# Patient Record
Sex: Male | Born: 1990 | Race: White | Hispanic: No | Marital: Single | Smoking: Never smoker
Health system: Southern US, Community
[De-identification: ages and names within clinical notes are randomized; demographics above are authoritative.]

## PROBLEM LIST (undated history)

## (undated) DIAGNOSIS — G809 Cerebral palsy, unspecified: Secondary | ICD-10-CM

## (undated) DIAGNOSIS — R569 Unspecified convulsions: Secondary | ICD-10-CM

## (undated) DIAGNOSIS — F809 Developmental disorder of speech and language, unspecified: Secondary | ICD-10-CM

## (undated) DIAGNOSIS — G808 Other cerebral palsy: Secondary | ICD-10-CM

## (undated) HISTORY — DX: Unspecified convulsions: R56.9

## (undated) HISTORY — PX: CIRCUMCISION: SUR203

---
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
## 2011-09-24 HISTORY — PX: TENDON RELEASE: SHX230

## 2011-09-24 HISTORY — PX: FOOT CAPSULE RELEASE W/ PERCUTANEOUS HEEL CORD LENGTHENING, TIBIAL TENDON TRANSFER: SHX1658

## 2011-12-12 ENCOUNTER — Ambulatory Visit: Payer: Medicaid Other | Admitting: *Deleted

## 2011-12-12 ENCOUNTER — Ambulatory Visit: Payer: Medicaid Other | Attending: Orthopedic Surgery | Admitting: Occupational Therapy

## 2011-12-12 ENCOUNTER — Ambulatory Visit: Payer: Medicaid Other | Attending: Orthopedic Surgery | Admitting: Physical Therapy

## 2011-12-12 DIAGNOSIS — M2560 Stiffness of unspecified joint, not elsewhere classified: Secondary | ICD-10-CM | POA: Insufficient documentation

## 2011-12-12 DIAGNOSIS — G808 Other cerebral palsy: Secondary | ICD-10-CM | POA: Insufficient documentation

## 2011-12-12 DIAGNOSIS — IMO0001 Reserved for inherently not codable concepts without codable children: Secondary | ICD-10-CM | POA: Insufficient documentation

## 2011-12-27 ENCOUNTER — Ambulatory Visit: Payer: Medicaid Other | Admitting: Occupational Therapy

## 2012-01-02 ENCOUNTER — Encounter: Payer: Medicaid Other | Admitting: Occupational Therapy

## 2012-01-02 ENCOUNTER — Ambulatory Visit: Payer: Medicaid Other | Attending: Orthopedic Surgery | Admitting: Occupational Therapy

## 2012-01-02 DIAGNOSIS — G808 Other cerebral palsy: Secondary | ICD-10-CM | POA: Insufficient documentation

## 2012-01-02 DIAGNOSIS — M2560 Stiffness of unspecified joint, not elsewhere classified: Secondary | ICD-10-CM | POA: Insufficient documentation

## 2012-01-02 DIAGNOSIS — IMO0001 Reserved for inherently not codable concepts without codable children: Secondary | ICD-10-CM | POA: Insufficient documentation

## 2012-01-10 ENCOUNTER — Ambulatory Visit: Payer: Medicaid Other | Admitting: Occupational Therapy

## 2012-01-23 ENCOUNTER — Ambulatory Visit: Payer: Medicaid Other | Admitting: Occupational Therapy

## 2012-05-06 ENCOUNTER — Other Ambulatory Visit: Payer: Self-pay

## 2012-05-06 DIAGNOSIS — G40209 Localization-related (focal) (partial) symptomatic epilepsy and epileptic syndromes with complex partial seizures, not intractable, without status epilepticus: Secondary | ICD-10-CM

## 2012-05-06 DIAGNOSIS — G40309 Generalized idiopathic epilepsy and epileptic syndromes, not intractable, without status epilepticus: Secondary | ICD-10-CM

## 2012-05-06 MED ORDER — DEPAKOTE 250 MG PO TBEC: DELAYED_RELEASE_TABLET | ORAL | Status: DC

## 2012-05-27 ENCOUNTER — Ambulatory Visit (INDEPENDENT_AMBULATORY_CARE_PROVIDER_SITE_OTHER): Payer: Medicaid Other | Admitting: Family

## 2012-05-27 ENCOUNTER — Encounter: Payer: Self-pay | Admitting: Family

## 2012-05-27 VITALS — BP 116/72 | HR 84 | Ht 66.75 in | Wt 178.0 lb

## 2012-05-27 DIAGNOSIS — G40209 Localization-related (focal) (partial) symptomatic epilepsy and epileptic syndromes with complex partial seizures, not intractable, without status epilepticus: Secondary | ICD-10-CM

## 2012-05-27 DIAGNOSIS — G40309 Generalized idiopathic epilepsy and epileptic syndromes, not intractable, without status epilepticus: Secondary | ICD-10-CM

## 2012-05-27 DIAGNOSIS — F809 Developmental disorder of speech and language, unspecified: Secondary | ICD-10-CM

## 2012-05-27 DIAGNOSIS — G808 Other cerebral palsy: Secondary | ICD-10-CM

## 2012-05-27 DIAGNOSIS — G830 Diplegia of upper limbs: Secondary | ICD-10-CM

## 2012-05-27 DIAGNOSIS — F801 Expressive language disorder: Secondary | ICD-10-CM

## 2012-05-27 MED ORDER — DEPAKOTE 250 MG PO TBEC: DELAYED_RELEASE_TABLET | ORAL | Status: DC

## 2012-05-27 NOTE — Progress Notes (Signed)
Patient: Carl Bryant MRN: [id number] Sex: male DOB: 1990-10-23  Provider: Elveria Rising, NP Location of Care: [facility] Child Neurology  Note type: Routine return visit  History of Present Illness:  Referral Source: Dr. Teena Irani. Rubin History from: Father Chief Complaint: Epilepsy/Hemiplegic Infantile Cerebral Palsy  Carl Bryant is a 22 y.o. 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  He also has attention-deficit disorder, mixed type. He has had no seizures since 01/17/1999.  He is no longer taking Concerta since being out of school.   Since Carl Bryant was last seen in March, 2013, his father reports that he had surgery by Dr. 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 His father said that he had some initial difficulty with pain control and with mobility but overall did well with recovery. He has a follow up appointment soon and he expects that plans may be made to do surgery on his fingers. Carl Bryant has been wearing an AFO on his right lower extremity since the surgery.  Since recovering from his surgery, Carl Bryant has been working part time at [employer] and works part time helping an Event organiser. He will be attending [employer] this summer. He has been otherwise healthy and has had no seizures.  Review of Systems: 12 system review was unremarkable  Past Medical History  Diagnosis Date  . Seizures    Hospitalizations: yes, Head Injury: no, Nervous System Infections: no, Immunizations up to date: yes Past Medical History Comments: right hemiparesis, diplegic gait, complex partial seizures with secondary generalization, developmental language disorder, dyscalculia  Birth History He is adopted  Behavior History attention difficulties  Surgical  History History reviewed. No pertinent past surgical history. Surgeries: yes Surgical History Comments: Surgery on right wrist and elbow and right foot surgery Sept. 2013.  Family History family history is not on file. He is adopted. Family History is negative migraines, seizures, cognitive impairment, blindness, deafness, birth defects, chromosomal disorder, autism.  Social History History   Social History  . Marital Status: Single    Spouse Name: N/A    Number of Children: N/A  . Years of Education: N/A   Social History Main Topics  . Smoking status: Never Smoker   . Smokeless tobacco: None  . Alcohol Use: No  . Drug Use: No  . Sexually Active: None   Other Topics Concern  . None   Social History Narrative  . 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 Living with Adoptive parents and younger adoptive sister  Hobbies/Interest: enjoys sports, involved with [employer]  Current Outpatient Prescriptions on File Prior to Visit  Medication Sig Dispense Refill  . DEPAKOTE 250 MG DR tablet Take 1 tab by mouth 4 times daily.  120 tablet  0   No current facility-administered medications on file prior to visit.   The medication list was reviewed and reconciled. All changes or newly prescribed medications were explained.  A complete medication list was provided to the patient/caregiver.  No Known Allergies  Physical Exam BP 116/72  Pulse 84  Ht 5' 6.75" (1.695 m)  Wt 178 lb (80.74 kg)  BMI 28.1 kg/m2 General: well developed, well nourished male, seated on exam table, in no  evident distress Head: head  normocephalic and atraumatic.  Ears, Nose and Throat: Oropharynx benign Neck: supple with no carotid or supraclavicular bruits. Respiratory: lungs clear to auscultation Cardiovascular: regular rate and rhythm, no  murmurs Musculoskeletal: Hemiatrophy and contractures of his right wrist and elbow, as well as both knees. Severe right spastic hemiparesis Skin: mild facial acne Trunk: normal alignment and mobility, no deformity  Neurologic Exam  Mental Status: Awake and fully alert.  Mild mental retardation. Able to answer questions appropriately.  Mood and affect appropriate. Cranial Nerves: Fundoscopic exam reveals sharp disc margins.  Pupils equal, briskly reactive to light.  Extraocular movements full without nystagmus.  Visual fields full to confrontation.  Hearing intact and symmetric to finger rub.  Facial sensation intact.  Face, tongue, palate move normally and symmetrically.  Neck flexion and extension normal. Motor: Hemiatrophy and contractures of his right wrist and elbow, as well as both knees. Severe right spastic hemiparesis. He has 4/5 strength proximally on the right and 3/5 strength distally. The left extremities have normal strength. He has limited ability to perform finger opposition on the right, with the thumb and forefinger barely touching.  Sensory: Intact to touch and temperature in all extremities. Coordination: Coordination good on the left, poor on the right.  Romberg negative. Gait and Station: He has a spastic diplegic gait that is more pronounced than when last seen. His left foot over-pronates when he walks. He complains of some pain in the left knee when walking.  Reflexes: 1+ and symmetric. There is no clonus.  Assessment and Plan Carl Bryant is a 22 year old young man with history of congenital right hemiparesis, diplegic gait, complex partial seizures with secondary generalization, developmental language disorder and dyscalculia, He has had no seizures and is tolerating Depakote well. I will make no changes in his treatment plan at this time. I have asked him to return for follow up in 1 year or sooner if needed. Dad agrees with this plan.

## 2012-05-30 ENCOUNTER — Encounter: Payer: Self-pay | Admitting: Family

## 2012-05-30 DIAGNOSIS — G40209 Localization-related (focal) (partial) symptomatic epilepsy and epileptic syndromes with complex partial seizures, not intractable, without status epilepticus: Secondary | ICD-10-CM | POA: Insufficient documentation

## 2012-05-30 DIAGNOSIS — G808 Other cerebral palsy: Secondary | ICD-10-CM | POA: Insufficient documentation

## 2012-05-30 DIAGNOSIS — G40309 Generalized idiopathic epilepsy and epileptic syndromes, not intractable, without status epilepticus: Secondary | ICD-10-CM | POA: Insufficient documentation

## 2012-05-30 DIAGNOSIS — G830 Diplegia of upper limbs: Secondary | ICD-10-CM | POA: Insufficient documentation

## 2012-05-30 DIAGNOSIS — F809 Developmental disorder of speech and language, unspecified: Secondary | ICD-10-CM | POA: Insufficient documentation

## 2012-05-30 NOTE — Patient Instructions (Signed)
Continue taking Depakote without change.  Let me know if you experience any seizures or have any other concerns. Plan to return for follow up in 1 year or sooner if needed.

## 2012-08-20 ENCOUNTER — Ambulatory Visit: Payer: Medicaid Other | Admitting: Family

## 2013-06-06 ENCOUNTER — Other Ambulatory Visit: Payer: Self-pay

## 2013-06-06 DIAGNOSIS — G40209 Localization-related (focal) (partial) symptomatic epilepsy and epileptic syndromes with complex partial seizures, not intractable, without status epilepticus: Secondary | ICD-10-CM

## 2013-06-06 DIAGNOSIS — G40309 Generalized idiopathic epilepsy and epileptic syndromes, not intractable, without status epilepticus: Secondary | ICD-10-CM

## 2013-06-06 MED ORDER — DEPAKOTE 250 MG PO TBEC: DELAYED_RELEASE_TABLET | ORAL | Status: DC

## 2013-06-10 ENCOUNTER — Encounter: Payer: Self-pay | Admitting: Family

## 2013-06-10 ENCOUNTER — Ambulatory Visit (INDEPENDENT_AMBULATORY_CARE_PROVIDER_SITE_OTHER): Payer: Medicaid Other | Admitting: Family

## 2013-06-10 VITALS — BP 124/80 | HR 82 | Ht 66.75 in | Wt 192.2 lb

## 2013-06-10 DIAGNOSIS — G808 Other cerebral palsy: Secondary | ICD-10-CM

## 2013-06-10 DIAGNOSIS — G40309 Generalized idiopathic epilepsy and epileptic syndromes, not intractable, without status epilepticus: Secondary | ICD-10-CM

## 2013-06-10 DIAGNOSIS — G830 Diplegia of upper limbs: Secondary | ICD-10-CM

## 2013-06-10 DIAGNOSIS — G40209 Localization-related (focal) (partial) symptomatic epilepsy and epileptic syndromes with complex partial seizures, not intractable, without status epilepticus: Secondary | ICD-10-CM

## 2013-06-10 DIAGNOSIS — F801 Expressive language disorder: Secondary | ICD-10-CM

## 2013-06-10 DIAGNOSIS — F809 Developmental disorder of speech and language, unspecified: Secondary | ICD-10-CM

## 2013-06-10 MED ORDER — DEPAKOTE 250 MG PO TBEC: DELAYED_RELEASE_TABLET | ORAL | Status: DC

## 2013-06-10 NOTE — Progress Notes (Signed)
Patient: Carl Bryant Shilling MRN: [id number] Sex: male DOB: [birth year]  Provider: Elveria RisingGOODPASTURE, Brenyn Petrey, NP [facility]  Note type: Routine return visit  History of Present Illness: Referral Source: Dr.David M. Rubin History from: patient's father Chief Complaint: Cerebral Palsy/Seizures  Carl Bryant Lavalle is a 23 y.o. 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 He also has attention-deficit disorder, mixed type but no longer takes medication for this since he is out of school. He has had no seizures since 01/17/1999.  Today Carl Bryant's father reports that Carl Bryant has been doing well since he was last seen on May 30, 2012. He went skiing in the winter and did well with that. He will be playing soccer in [employer] this year as well as [employer]. He will be attending [employer] for 6 weeks and is looking forward to swimming there.  Carl Bryant continues to follow 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 His father says that he  May be having a plate removed from his wrist as well as surgery on the 3rd finger of this right hand that looks to be developing a trigger finger type deformity.   Carl Bryant has been otherwise healthy since last seen. He and his father have no concerns or complaints today.  Review of Systems: 12 system review was unremarkable  Past Medical History  Diagnosis Date  . Seizures    Hospitalizations: no, Head Injury: no, Nervous System Infections: no, Immunizations up to date: yes Past Medical History Comments: right hemiparesis, diplegic gait, complex partial seizures with secondary generalization, developmental language disorder, dyscalculia   Surgical History Past Surgical History  Procedure  Laterality Date  . Tendon release  09/2011    right wrist, elbow  . Foot capsule release Bryant/ percutaneous heel cord lengthening, tibial tendon transfer  09/2011    right foot    Family History family history is not on file. He was adopted. Family History is otherwise negative for migraines, seizures, cognitive impairment, blindness, deafness, birth defects, chromosomal disorder, autism.  Social History History   Social History  . Marital Status: Single    Spouse Name: N/A    Number of Children: N/A  . Years of Education: N/A   Social History Main Topics  . Smoking status: Never Smoker   . Smokeless tobacco: Never Used  . Alcohol Use: No  . Drug Use: No  . Sexual Activity: No   Other Topics Concern  . None   Social History Narrative  . None   Educational level: 12th grade School Attending:N/A Living with:  both parents and sister  Hobbies/Interest: watching sports like skiing School comments:  Carl Bryant is currently volunteering at [employer] [place] [employer].  Physical Exam BP 124/80  Pulse 82  Ht 5' 6.75" (1.695 m)  Wt 192 lb 3.2 oz (87.181 kg)  BMI 30.34 kg/m2 General: well developed, well nourished male, seated in exam room, in no evident distress  Head: head normocephalic and atraumatic.  Ears, Nose and Throat: Oropharynx benign  Neck: supple with no carotid or supraclavicular bruits.  Respiratory: lungs clear to auscultation  Cardiovascular: regular rate and rhythm, no murmurs  Musculoskeletal: Hemiatrophy and contractures of his right wrist and elbow, as well as both knees. The 3rd finger of the right hand is  flexed toward the palm. Severe right spastic hemiparesis  Skin: mild facial acne  Trunk: normal alignment and mobility, no deformity   Neurologic Exam  Mental Status: Awake and fully alert. Mild cognitive delay. Able to answer basic questions appropriately. Mood and affect appropriate.  Cranial Nerves: Fundoscopic exam reveals  sharp disc margins. Pupils equal, briskly reactive to light. Extraocular movements full without nystagmus. Visual fields full to confrontation. Hearing intact and symmetric to finger rub. Facial sensation intact. Face, tongue, palate move normally and symmetrically. Neck flexion and extension normal.  Motor: Hemiatrophy and contractures of his right wrist and elbow, as well as both knees. Severe right spastic hemiparesis. He has 4/5 strength proximally on the right and 3/5 strength distally. The left extremities have normal strength. He has limited ability to perform finger opposition on the right, with the thumb and forefinger barely touching.  Sensory: Intact to touch and temperature in all extremities.  Coordination: Coordination good on the left, poor on the right. Romberg negative.  Gait and Station: He has a spastic diplegic gait. His left foot over-pronates when he walks.  Reflexes: 1+ and symmetric. There is no clonus.   Assessment and Plan Carl Bryant is a 23 year old young man with history of congenital right hemiparesis, diplegic gait, complex partial seizures with secondary generalization, developmental language disorder and dyscalculia, He has had no seizures and is tolerating Depakote well. I will make no changes in his treatment plan at this time. I completed forms for him to participate in [employer]. I will see him in follow up in 1 year or sooner if needed. Dad agrees with this plan

## 2013-06-12 ENCOUNTER — Encounter: Payer: Self-pay | Admitting: Family

## 2013-06-12 NOTE — Patient Instructions (Signed)
Continue Gates's medications without change.  Call me if he has any seizures or if you have any concerns.  Please plan to return for follow up in 1 year or sooner if needed.

## 2014-01-01 ENCOUNTER — Other Ambulatory Visit: Payer: Self-pay

## 2014-01-01 DIAGNOSIS — G40309 Generalized idiopathic epilepsy and epileptic syndromes, not intractable, without status epilepticus: Secondary | ICD-10-CM

## 2014-01-01 DIAGNOSIS — G40209 Localization-related (focal) (partial) symptomatic epilepsy and epileptic syndromes with complex partial seizures, not intractable, without status epilepticus: Secondary | ICD-10-CM

## 2014-01-01 MED ORDER — DEPAKOTE 250 MG PO TBEC: DELAYED_RELEASE_TABLET | ORAL | Status: DC

## 2014-06-11 ENCOUNTER — Ambulatory Visit (INDEPENDENT_AMBULATORY_CARE_PROVIDER_SITE_OTHER): Payer: Medicaid Other | Admitting: Family

## 2014-06-11 ENCOUNTER — Encounter: Payer: Self-pay | Admitting: Family

## 2014-06-11 VITALS — BP 128/84 | HR 86 | Ht 66.75 in | Wt 194.4 lb

## 2014-06-11 DIAGNOSIS — G830 Diplegia of upper limbs: Secondary | ICD-10-CM | POA: Diagnosis not present

## 2014-06-11 DIAGNOSIS — M2450 Contracture, unspecified joint: Secondary | ICD-10-CM | POA: Diagnosis not present

## 2014-06-11 DIAGNOSIS — G808 Other cerebral palsy: Secondary | ICD-10-CM | POA: Diagnosis not present

## 2014-06-11 DIAGNOSIS — G40209 Localization-related (focal) (partial) symptomatic epilepsy and epileptic syndromes with complex partial seizures, not intractable, without status epilepticus: Secondary | ICD-10-CM

## 2014-06-11 DIAGNOSIS — G40309 Generalized idiopathic epilepsy and epileptic syndromes, not intractable, without status epilepticus: Secondary | ICD-10-CM | POA: Diagnosis not present

## 2014-06-11 DIAGNOSIS — F809 Developmental disorder of speech and language, unspecified: Secondary | ICD-10-CM | POA: Diagnosis not present

## 2014-06-11 NOTE — Progress Notes (Signed)
Patient: Carl Bryant MRN: [id number] Sex: male DOB: [birth year]  Provider: Elveria RisingGOODPASTURE, Preet Perrier, NP [facility]  Note type: Routine return visit  History of Present Illness: Referral Source: Dr. Teena Iraniavid M. Rubin History from: patient and his mother Chief Complaint: Cerebral Palsy/Seizures   Carl Bryant is a 24 y.o. 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 He also has attention-deficit disorder, mixed type but no longer takes medication for this since he is out of school. He has had no seizures since 01/17/1999. Carl Bryant was last seen Jun 10, 2013.  Today Carl Bryant's mother reports that Carl Bryant has been doing well since he was last seen. 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 Terel bowls regularly with his father.  Carl Bryant continues to follow up with 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 He may need to have a plate removed from his wrist as well as surgery on the 3rd finger of this right hand that looks to be developing a trigger finger type deformity.   Carl Bryant has been otherwise healthy since last seen. His mother says that he sometimes complains that his heart beats fast. She has noticed that he mentions it when he is excited or anxious about something. The most recent complaints occurred while at a rodeo and he became excited while watching bull riding.  Neither Carl Bryant nor his mother have other health concerns about him today.  Review of Systems: Please see the HPI for neurologic and other pertinent review of systems. Otherwise, the following systems are noncontributory including constitutional, eyes, ears, nose and throat, cardiovascular, respiratory,  gastrointestinal, genitourinary, musculoskeletal, skin, endocrine, hematologic/lymph, allergic/immunologic and psychiatric.   Past Medical History  Diagnosis Date  . Seizures    Hospitalizations: No., Head Injury: No., Nervous System Infections: No., Immunizations up to date: Yes.   Past Medical History Comments: right hemiparesis, diplegic gait, complex partial seizures with secondary generalization, developmental language disorder, dyscalculia   Surgical History Past Surgical History  Procedure Laterality Date  . Tendon release  09/2011    right wrist, elbow  . Foot capsule release w/ percutaneous heel cord lengthening, tibial tendon transfer  09/2011    right foot  . Circumcision  1990    Family History family history is not on file. He was adopted. Family History is otherwise negative for migraines, seizures, cognitive impairment, blindness, deafness, birth defects, chromosomal disorder, autism.  Social History History   Social History  . Marital Status: Single    Spouse Name: N/A  . Number of Children: N/A  . Years of Education: N/A   Social History Main Topics  . Smoking status: Never Smoker   . Smokeless tobacco: Never Used  . Alcohol Use: No  . Drug Use: No  . Sexual Activity: No   Other Topics Concern  . 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 School comments:  Carl Bryant completed his education in 2013 with a certificate from [employer].  Allergies No Known Allergies  Physical Exam BP 128/84 mmHg  Pulse 86  Ht 5' 6.75" (1.695 m)  Wt 194 lb 6.4 oz (88.179  kg)  BMI 30.69 kg/m2 General: well developed, well nourished male, seated in exam room, in no evident distress  Head: head normocephalic and atraumatic.  Ears, Nose and Throat: Oropharynx  benign  Neck: supple with no carotid or supraclavicular bruits.  Respiratory: lungs clear to auscultation  Cardiovascular: regular rate and rhythm, no murmurs  Musculoskeletal: Hemiatrophy and contractures of his right wrist and elbow, as well as both knees. The 3rd finger of the right hand is flexed toward the palm. Severe right spastic hemiparesis  Skin: mild facial acne  Trunk: normal alignment and mobility, no deformity   Neurologic Exam  Mental Status: Awake and fully alert. Mild cognitive delay. Able to answer basic questions appropriately. Mood and affect appropriate.  Cranial Nerves: Fundoscopic exam reveals sharp disc margins. Pupils equal, briskly reactive to light. Extraocular movements full without nystagmus. Visual fields full to confrontation. Hearing intact and symmetric to finger rub. Facial sensation intact. Face, tongue, palate move normally and symmetrically. Neck flexion and extension normal.  Motor: Hemiatrophy and contractures of his right wrist and elbow, as well as both knees. Severe right spastic hemiparesis. He has 4/5 strength proximally on the right and 3/5 strength distally. The left extremities have normal strength. He has limited ability to perform finger opposition on the right, with the thumb and forefinger barely touching.  Sensory: Intact to touch and temperature in all extremities.  Coordination: Coordination good on the left, poor on the right. Romberg negative.  Gait and Station: He has a spastic diplegic gait. His left foot over-pronates when he walks.  Reflexes: 1+ and symmetric. There is no clonus.   Impression 1. Right hemiparesis 2. Diplegic gait 3. Complex partial seizures with secondary generalization 4. Developmental language disorder 5. Dyscalculia 6. Attention deficit disorder  Recommendations for plan of care The patient's [facility] were reviewed. Carl Bryant has neither had nor required imaging or lab studies since the  last visit. He is a 24 year old young man with history of congenital right hemiparesis, diplegic gait, complex partial seizures with secondary generalization, developmental language disorder and dyscalculia, He has had no seizures and is tolerating Depakote well. I will make no changes in his treatment plan at this time. He reports occasional feelings that his heart is beating fast. The examination is normal today. I encouraged Carl Bryant and his mother to report this to his PCP.  I will see him in follow up in 1 year or sooner if needed. Carl Bryant and his mother agree with this plan  The medication list was reviewed and reconciled.  No changes were made in the prescribed medications today.  A complete medication list was provided to the patient/caregiver.  Total time spent with the patient was 25 minutes, of which 50% or more was spent in counseling and coordination of care.

## 2014-06-12 DIAGNOSIS — M2450 Contracture, unspecified joint: Secondary | ICD-10-CM | POA: Insufficient documentation

## 2014-06-12 NOTE — Patient Instructions (Signed)
Continue Kao's Depakote without change. Let me know if he has any breakthrough seizures.  Follow up with Dr Jacky KindleAronson about his occasional feeling that his heart is beating fast.   Please plan to return for follow up in 1 year or sooner if needed.

## 2014-07-03 ENCOUNTER — Other Ambulatory Visit: Payer: Self-pay

## 2014-07-03 DIAGNOSIS — G40209 Localization-related (focal) (partial) symptomatic epilepsy and epileptic syndromes with complex partial seizures, not intractable, without status epilepticus: Secondary | ICD-10-CM

## 2014-07-03 DIAGNOSIS — G40309 Generalized idiopathic epilepsy and epileptic syndromes, not intractable, without status epilepticus: Secondary | ICD-10-CM

## 2014-07-03 MED ORDER — DEPAKOTE 250 MG PO TBEC: DELAYED_RELEASE_TABLET | ORAL | Status: DC

## 2014-12-31 ENCOUNTER — Other Ambulatory Visit: Payer: Self-pay

## 2014-12-31 DIAGNOSIS — G40309 Generalized idiopathic epilepsy and epileptic syndromes, not intractable, without status epilepticus: Secondary | ICD-10-CM

## 2014-12-31 DIAGNOSIS — G40209 Localization-related (focal) (partial) symptomatic epilepsy and epileptic syndromes with complex partial seizures, not intractable, without status epilepticus: Secondary | ICD-10-CM

## 2014-12-31 MED ORDER — DEPAKOTE 250 MG PO TBEC: DELAYED_RELEASE_TABLET | ORAL | Status: DC

## 2015-01-24 ENCOUNTER — Emergency Department (INDEPENDENT_AMBULATORY_CARE_PROVIDER_SITE_OTHER): Payer: Medicaid Other

## 2015-01-24 ENCOUNTER — Encounter (HOSPITAL_COMMUNITY): Payer: Self-pay | Admitting: Emergency Medicine

## 2015-01-24 ENCOUNTER — Inpatient Hospital Stay (HOSPITAL_COMMUNITY)
Admission: EM | Admit: 2015-01-24 | Discharge: 2015-01-29 | DRG: 871 | Disposition: A | Discharge status: Home / Self Care | Payer: Medicaid Other | Attending: Internal Medicine | Admitting: Internal Medicine

## 2015-01-24 ENCOUNTER — Encounter (HOSPITAL_COMMUNITY): Payer: Self-pay | Admitting: Nurse Practitioner

## 2015-01-24 ENCOUNTER — Emergency Department (INDEPENDENT_AMBULATORY_CARE_PROVIDER_SITE_OTHER)
Admission: EM | Admit: 2015-01-24 | Discharge: 2015-01-24 | Disposition: A | Discharge status: Home / Self Care | Payer: Medicaid Other | Source: Home / Self Care | Attending: Internal Medicine | Admitting: Internal Medicine

## 2015-01-24 DIAGNOSIS — E870 Hyperosmolality and hypernatremia: Secondary | ICD-10-CM | POA: Diagnosis not present

## 2015-01-24 DIAGNOSIS — J181 Lobar pneumonia, unspecified organism: Principal | ICD-10-CM

## 2015-01-24 DIAGNOSIS — E876 Hypokalemia: Secondary | ICD-10-CM | POA: Diagnosis not present

## 2015-01-24 DIAGNOSIS — E860 Dehydration: Secondary | ICD-10-CM | POA: Diagnosis present

## 2015-01-24 DIAGNOSIS — T39395A Adverse effect of other nonsteroidal anti-inflammatory drugs [NSAID], initial encounter: Secondary | ICD-10-CM | POA: Diagnosis present

## 2015-01-24 DIAGNOSIS — F809 Developmental disorder of speech and language, unspecified: Secondary | ICD-10-CM | POA: Diagnosis present

## 2015-01-24 DIAGNOSIS — G808 Other cerebral palsy: Secondary | ICD-10-CM | POA: Diagnosis present

## 2015-01-24 DIAGNOSIS — R945 Abnormal results of liver function studies: Secondary | ICD-10-CM

## 2015-01-24 DIAGNOSIS — J189 Pneumonia, unspecified organism: Secondary | ICD-10-CM

## 2015-01-24 DIAGNOSIS — G40409 Other generalized epilepsy and epileptic syndromes, not intractable, without status epilepticus: Secondary | ICD-10-CM | POA: Diagnosis present

## 2015-01-24 DIAGNOSIS — N179 Acute kidney failure, unspecified: Secondary | ICD-10-CM | POA: Diagnosis present

## 2015-01-24 DIAGNOSIS — R7989 Other specified abnormal findings of blood chemistry: Secondary | ICD-10-CM

## 2015-01-24 DIAGNOSIS — A413 Sepsis due to Hemophilus influenzae: Secondary | ICD-10-CM | POA: Diagnosis present

## 2015-01-24 DIAGNOSIS — F70 Mild intellectual disabilities: Secondary | ICD-10-CM | POA: Diagnosis present

## 2015-01-24 DIAGNOSIS — G40309 Generalized idiopathic epilepsy and epileptic syndromes, not intractable, without status epilepticus: Secondary | ICD-10-CM | POA: Diagnosis present

## 2015-01-24 DIAGNOSIS — A4150 Gram-negative sepsis, unspecified: Principal | ICD-10-CM | POA: Diagnosis present

## 2015-01-24 DIAGNOSIS — R112 Nausea with vomiting, unspecified: Secondary | ICD-10-CM | POA: Diagnosis present

## 2015-01-24 DIAGNOSIS — Z79899 Other long term (current) drug therapy: Secondary | ICD-10-CM

## 2015-01-24 DIAGNOSIS — A419 Sepsis, unspecified organism: Secondary | ICD-10-CM | POA: Diagnosis present

## 2015-01-24 HISTORY — DX: Other cerebral palsy: G80.8

## 2015-01-24 HISTORY — DX: Developmental disorder of speech and language, unspecified: F80.9

## 2015-01-24 HISTORY — DX: Cerebral palsy, unspecified: G80.9

## 2015-01-24 LAB — COMPREHENSIVE METABOLIC PANEL
ALBUMIN: 3.3 g/dL — AB (ref 3.5–5.0)
ALK PHOS: 65 U/L (ref 38–126)
ALT: 45 U/L (ref 17–63)
AST: 30 U/L (ref 15–41)
Anion gap: 14 (ref 5–15)
BILIRUBIN TOTAL: 0.8 mg/dL (ref 0.3–1.2)
BUN: 9 mg/dL (ref 6–20)
CALCIUM: 9.4 mg/dL (ref 8.9–10.3)
CO2: 22 mmol/L (ref 22–32)
Chloride: 103 mmol/L (ref 101–111)
Creatinine, Ser: 1.26 mg/dL — ABNORMAL HIGH (ref 0.61–1.24)
GFR calc Af Amer: >60 mL/min (ref >60)
GFR calc non Af Amer: >60 mL/min (ref >60)
GLUCOSE: 109 mg/dL — AB (ref 65–99)
Potassium: 4.3 mmol/L (ref 3.5–5.1)
SODIUM: 139 mmol/L (ref 135–145)
TOTAL PROTEIN: 7.1 g/dL (ref 6.5–8.1)

## 2015-01-24 LAB — URINE MICROSCOPIC-ADD ON
BACTERIA UA: NONE SEEN
RBC / HPF: NONE SEEN RBC/hpf (ref 0–5)
WBC, UA: NONE SEEN WBC/hpf (ref 0–5)

## 2015-01-24 LAB — CBC WITH DIFFERENTIAL/PLATELET
BASOS ABS: 0 10*3/uL (ref 0.0–0.1)
BASOS PCT: 0 %
EOS PCT: 0 %
Eosinophils Absolute: 0 10*3/uL (ref 0.0–0.7)
HEMATOCRIT: 39.8 % (ref 39.0–52.0)
Hemoglobin: 13.5 g/dL (ref 13.0–17.0)
Lymphocytes Relative: 10 %
Lymphs Abs: 1.3 10*3/uL (ref 0.7–4.0)
MCH: 28.2 pg (ref 26.0–34.0)
MCHC: 33.9 g/dL (ref 30.0–36.0)
MCV: 83.3 fL (ref 78.0–100.0)
MONO ABS: 2.1 10*3/uL — AB (ref 0.1–1.0)
MONOS PCT: 15 %
Neutro Abs: 10.4 10*3/uL — ABNORMAL HIGH (ref 1.7–7.7)
Neutrophils Relative %: 75 %
PLATELETS: 193 10*3/uL (ref 150–400)
RBC: 4.78 MIL/uL (ref 4.22–5.81)
RDW: 15.3 % (ref 11.5–15.5)
WBC: 13.8 10*3/uL — ABNORMAL HIGH (ref 4.0–10.5)

## 2015-01-24 LAB — I-STAT CG4 LACTIC ACID, ED: Lactic Acid, Venous: 2.69 mmol/L (ref 0.5–2.0)

## 2015-01-24 LAB — URINALYSIS, ROUTINE W REFLEX MICROSCOPIC
BILIRUBIN URINE: NEGATIVE
GLUCOSE, UA: NEGATIVE mg/dL
KETONES UR: NEGATIVE mg/dL
LEUKOCYTES UA: NEGATIVE
NITRITE: NEGATIVE
PH: 7.5 (ref 5.0–8.0)
PROTEIN: NEGATIVE mg/dL
Specific Gravity, Urine: 1.007 (ref 1.005–1.030)

## 2015-01-24 LAB — LACTIC ACID, PLASMA
LACTIC ACID, VENOUS: 2.7 mmol/L — AB (ref 0.5–2.0)
Lactic Acid, Venous: 3.4 mmol/L (ref 0.5–2.0)

## 2015-01-24 MED ORDER — IBUPROFEN 400 MG PO TABS
ORAL_TABLET | ORAL | Status: AC
  Filled 2015-01-24: qty 1

## 2015-01-24 MED ORDER — ACETAMINOPHEN 325 MG PO TABS
ORAL_TABLET | ORAL | Status: AC
  Filled 2015-01-24: qty 2

## 2015-01-24 MED ORDER — SODIUM CHLORIDE 0.9 % IV BOLUS (SEPSIS)
1000.0000 mL | Freq: Once | INTRAVENOUS | Status: AC
  Administered 2015-01-24: 1000 mL via INTRAVENOUS

## 2015-01-24 MED ORDER — IBUPROFEN 200 MG PO TABS
600.0000 mg | ORAL_TABLET | Freq: Once | ORAL | Status: AC
  Administered 2015-01-24: 600 mg via ORAL

## 2015-01-24 MED ORDER — IBUPROFEN 200 MG PO TABS
ORAL_TABLET | ORAL | Status: AC
  Filled 2015-01-24: qty 1

## 2015-01-24 MED ORDER — SODIUM CHLORIDE 0.9 % IV SOLN: Freq: Once | INTRAVENOUS | Status: DC

## 2015-01-24 MED ORDER — ACETAMINOPHEN 325 MG PO TABS
650.0000 mg | ORAL_TABLET | Freq: Once | ORAL | Status: AC
  Administered 2015-01-24: 650 mg via ORAL

## 2015-01-24 MED ORDER — DEXTROSE 5 % IV SOLN
500.0000 mg | INTRAVENOUS | Status: DC
  Administered 2015-01-25: 500 mg via INTRAVENOUS
  Filled 2015-01-24: qty 500

## 2015-01-24 MED ORDER — DEXTROSE 5 % IV SOLN
1.0000 g | INTRAVENOUS | Status: DC
  Administered 2015-01-25: 1 g via INTRAVENOUS
  Filled 2015-01-24 (×2): qty 10

## 2015-01-24 MED ORDER — AZITHROMYCIN 500 MG IV SOLR
500.0000 mg | Freq: Once | INTRAVENOUS | Status: AC
  Administered 2015-01-24: 500 mg via INTRAVENOUS
  Filled 2015-01-24: qty 500

## 2015-01-24 MED ORDER — ONDANSETRON HCL 4 MG/2ML IJ SOLN
4.0000 mg | Freq: Once | INTRAMUSCULAR | Status: AC
  Administered 2015-01-24: 4 mg via INTRAVENOUS
  Filled 2015-01-24: qty 2

## 2015-01-24 MED ORDER — SODIUM CHLORIDE 0.9 % IV SOLN: 1000.0000 mL | INTRAVENOUS | Status: DC

## 2015-01-24 MED ORDER — CEFTRIAXONE SODIUM 1 G IJ SOLR
1.0000 g | Freq: Once | INTRAMUSCULAR | Status: AC
  Administered 2015-01-24: 1 g via INTRAVENOUS
  Filled 2015-01-24: qty 10

## 2015-01-24 MED ORDER — SODIUM CHLORIDE 0.9 % IV BOLUS (SEPSIS)
1000.0000 mL | INTRAVENOUS | Status: AC
  Administered 2015-01-24 (×3): 1000 mL via INTRAVENOUS

## 2015-01-24 NOTE — ED Notes (Signed)
Patient called in main ED waiting area with no response 

## 2015-01-24 NOTE — ED Notes (Signed)
Patient is a special needs adult with mother as caregiver with him.  Patient and family went on a [place] cruise for 5 days and arrived back yesterday.  While on cruise, patient participated in snorkeling.  With childhood history of pneumonia, mother concerned patient is exhibiting the same symptoms as when diagnosed with pneumonia (multiple episodes as a child).  Mother reports family arrived home yesterday, and started complaining of headache and fever.  Mother reports fever yesterday was 103.4.

## 2015-01-24 NOTE — ED Provider Notes (Addendum)
CSN: [id number]     Arrival date & time 01/24/15  1323 History   First MD Initiated Contact with Patient 01/24/15 1507     Chief Complaint  Patient presents with  . Fever  . Chills  . Generalized Body Aches   HPI  Patient is a 25 year old gentleman who presents today with the abrupt onset of fever and headache, cough with posttussive emesis, that started yesterday. He has not had a flu shot this season. He does have some history of childhood pneumonias, and returned yesterday from a five-day cruise during which he went snorkeling. Parents are little concerned about the possibility of occult aspiration. Has some sore throat. Had an episode of dysuria today. No diarrhea. Also has reported a stomachache.  Past Medical History  Diagnosis Date  . Seizures [facility](HCC)    Past Surgical History  Procedure Laterality Date  . Tendon release  09/2011    right wrist, elbow  . Foot capsule release w/ percutaneous heel cord lengthening, tibial tendon transfer  09/2011    right foot  . Circumcision  1990   Family History  Problem Relation Age of Onset  . Adopted: Yes   Social History  Substance Use Topics  . Smoking status: Never Smoker   . Smokeless tobacco: Never Used  . Alcohol Use: No    Review of Systems  All other systems reviewed and are negative.   Allergies  Review of patient's allergies indicates no known allergies.  Home Medications   Prior to Admission medications   Medication Sig Start Date End Date Taking? Authorizing Provider  acetaminophen (TYLENOL) 325 MG tablet Take 650 mg by mouth every 6 (six) hours as needed.   Yes Historical Provider, MD  DEPAKOTE 250 MG DR tablet Take 1 tab by mouth 4 times daily. 12/31/14  Yes Elveria Risingina Goodpasture, NP  ibuprofen (ADVIL,MOTRIN) 200 MG tablet Take 200 mg by mouth every 6 (six) hours as needed.   Yes Historical Provider, MD     BP 121/76 mmHg  Pulse 140  Temp(Src) 98.2 F (36.8 C) (Oral)  Resp 30  SpO2 95%   Physical Exam   Constitutional: He is oriented to person, place, and time. No distress.  Alert, nicely groomed Reclining on exam table, able to sit up for exam Looks ill but not toxic   HENT:  Head: Atraumatic.  Bilateral TMs with marked dullness, patchy erythema Moderate nasal congestion with mucopurulent material present Throat is slightly red  Eyes:  Conjugate gaze, no eye redness/drainage  Neck: Neck supple.  Cardiovascular: Regular rhythm.   Heart rate 140s  Pulmonary/Chest: No respiratory distress. He has no wheezes.  Crackles posterior L lung fields, extending to mid axillary line  Abdominal: Soft.  Mild tenderness to deep palpation in the left lower quadrant Some guarding  Musculoskeletal: Normal range of motion. He exhibits no edema.  Neurological: He is alert and oriented to person, place, and time.  Skin: Skin is warm and dry.  No cyanosis  Nursing note and vitals reviewed.   ED Course  Procedures (including critical care time)   CLINICAL DATA: Fever and productive cough.  EXAM: CHEST 2 VIEW  COMPARISON: None.  FINDINGS: There is an extensive consolidative pneumonia in the posterior superior aspect of the left lower lobe. Prominent bilateral peribronchial thickening. No effusions. Heart size and vascularity are normal.  IMPRESSION: Left lower lobe pneumonia. Bronchitic changes.   Electronically Signed  By: Francene BoyersJames Maxwell M.D.  On: 01/24/2015 15:29      MDM  1. Left lower lobe pneumonia    Transferring to ED for consideration for IVF, iv antibiotics, for L lower lobe pneumonia with tachycardia, in young adult with history pneumonias, ? Cerebral palsy.     Eustace Moore, MD 01/24/15 1545  Eustace Moore, MD 01/25/15 [phone]

## 2015-01-24 NOTE — ED Notes (Signed)
Explained delay to pt and family. They request to speak to charge nurse.

## 2015-01-24 NOTE — ED Notes (Signed)
Urgent care transfer

## 2015-01-24 NOTE — ED Notes (Signed)
Pt has been in triage waiting the whole time.

## 2015-01-24 NOTE — ED Notes (Addendum)
First set of blood cultures were drawn in Triage.  Second set was accidentally clicked off.

## 2015-01-24 NOTE — ED Notes (Signed)
Lab called to report Lactic Acid 2.7.  Advised MD.

## 2015-01-24 NOTE — Progress Notes (Signed)
ANTIBIOTIC CONSULT NOTE - INITIAL  Pharmacy Consult for Ceftriaxone and Azithromycin Indication: pneumonia  No Known Allergies  Patient Measurements: Height: 5\' 7"  (170.2 cm) Weight: 198 lb 4 oz (89.926 kg) IBW/kg (Calculated) : 66.1  Vital Signs: Temp: 97.7 F (36.5 C) (01/01 2004) Temp Source: Oral (01/01 2004) BP: 116/68 mmHg (01/01 2004) Pulse Rate: 107 (01/01 2004) Intake/Output from previous day:   Intake/Output from this shift:    Labs:  Recent Labs  01/24/15 1639  WBC 13.8*  HGB 13.5  PLT 193  CREATININE 1.26*   Estimated Creatinine Clearance: 95 mL/min (by C-G formula based on Cr of 1.26). No results for input(s): VANCOTROUGH, VANCOPEAK, VANCORANDOM, GENTTROUGH, GENTPEAK, GENTRANDOM, TOBRATROUGH, TOBRAPEAK, TOBRARND, AMIKACINPEAK, AMIKACINTROU, AMIKACIN in the last 72 hours.   Microbiology: No results found for this or any previous visit (from the past 720 hour(s)).  Medical History: Past Medical History  Diagnosis Date  . Seizures (HCC)     Medications:  Anti-infectives    Start     Dose/Rate Route Frequency Ordered Stop   01/25/15 2200  azithromycin (ZITHROMAX) 500 mg in dextrose 5 % 250 mL IVPB     500 mg 250 mL/hr over 60 Minutes Intravenous Every 24 hours 01/24/15 2128     01/25/15 2100  cefTRIAXone (ROCEPHIN) 1 g in dextrose 5 % 50 mL IVPB     1 g 100 mL/hr over 30 Minutes Intravenous Every 24 hours 01/24/15 2128     01/24/15 2115  cefTRIAXone (ROCEPHIN) 1 g in dextrose 5 % 50 mL IVPB     1 g 100 mL/hr over 30 Minutes Intravenous  Once 01/24/15 2111     01/24/15 2115  azithromycin (ZITHROMAX) 500 mg in dextrose 5 % 250 mL IVPB     500 mg 250 mL/hr over 60 Minutes Intravenous  Once 01/24/15 2111       Assessment: 25 year old male with CAP to continue Ceftriaxone and Azithromycin.  These antibiotics do not require renal adjustment.  Plan:  Ceftriaxone 1g IV q24h Azithromycin 500mg  IV q24h As no dosage adjustment are required pharmacy  will sign off. Thank you for the consult.  Estella HuskMichelle Breigh Annett, Pharm.D., BCPS, AAHIVP Clinical Pharmacist Phone: [phone] or [phone] 01/24/2015, 9:31 PM

## 2015-01-24 NOTE — ED Notes (Addendum)
Ibuprofen 600mg  PO for fever was VO [facility] EDNP

## 2015-01-24 NOTE — ED Notes (Signed)
[facility] after [place] this [place]. CXR there showed LL lobe pneumonia. Pt has had body aches, fevers, and not feeling well since returning from the trip. He is A&Ox4, breathing easily. He was medicated with tylenol for fever [facility]

## 2015-01-25 ENCOUNTER — Encounter (HOSPITAL_COMMUNITY): Payer: Self-pay | Admitting: Internal Medicine

## 2015-01-25 DIAGNOSIS — E860 Dehydration: Secondary | ICD-10-CM | POA: Diagnosis present

## 2015-01-25 DIAGNOSIS — A419 Sepsis, unspecified organism: Secondary | ICD-10-CM | POA: Diagnosis not present

## 2015-01-25 DIAGNOSIS — N179 Acute kidney failure, unspecified: Secondary | ICD-10-CM | POA: Diagnosis present

## 2015-01-25 DIAGNOSIS — Z79899 Other long term (current) drug therapy: Secondary | ICD-10-CM | POA: Diagnosis not present

## 2015-01-25 DIAGNOSIS — G40309 Generalized idiopathic epilepsy and epileptic syndromes, not intractable, without status epilepticus: Secondary | ICD-10-CM | POA: Diagnosis not present

## 2015-01-25 DIAGNOSIS — J189 Pneumonia, unspecified organism: Secondary | ICD-10-CM | POA: Diagnosis not present

## 2015-01-25 DIAGNOSIS — E870 Hyperosmolality and hypernatremia: Secondary | ICD-10-CM | POA: Diagnosis not present

## 2015-01-25 DIAGNOSIS — G808 Other cerebral palsy: Secondary | ICD-10-CM | POA: Diagnosis present

## 2015-01-25 DIAGNOSIS — E876 Hypokalemia: Secondary | ICD-10-CM | POA: Diagnosis not present

## 2015-01-25 DIAGNOSIS — A413 Sepsis due to Hemophilus influenzae: Secondary | ICD-10-CM | POA: Diagnosis present

## 2015-01-25 DIAGNOSIS — R112 Nausea with vomiting, unspecified: Secondary | ICD-10-CM | POA: Diagnosis not present

## 2015-01-25 DIAGNOSIS — A4150 Gram-negative sepsis, unspecified: Secondary | ICD-10-CM | POA: Diagnosis present

## 2015-01-25 DIAGNOSIS — T39395A Adverse effect of other nonsteroidal anti-inflammatory drugs [NSAID], initial encounter: Secondary | ICD-10-CM | POA: Diagnosis present

## 2015-01-25 DIAGNOSIS — G40409 Other generalized epilepsy and epileptic syndromes, not intractable, without status epilepticus: Secondary | ICD-10-CM | POA: Diagnosis present

## 2015-01-25 DIAGNOSIS — F70 Mild intellectual disabilities: Secondary | ICD-10-CM | POA: Diagnosis present

## 2015-01-25 DIAGNOSIS — F809 Developmental disorder of speech and language, unspecified: Secondary | ICD-10-CM | POA: Diagnosis present

## 2015-01-25 LAB — INFLUENZA PANEL BY PCR (TYPE A & B)
H1N1FLUPCR: NOT DETECTED
INFLBPCR: NEGATIVE
Influenza A By PCR: NEGATIVE

## 2015-01-25 LAB — LIPASE, BLOOD: LIPASE: 23 U/L (ref 11–51)

## 2015-01-25 LAB — STREP PNEUMONIAE URINARY ANTIGEN: STREP PNEUMO URINARY ANTIGEN: NEGATIVE

## 2015-01-25 LAB — PROTIME-INR
INR: 1.39 (ref 0.00–1.49)
PROTHROMBIN TIME: 17.2 s — AB (ref 11.6–15.2)

## 2015-01-25 LAB — PROCALCITONIN: Procalcitonin: 27.4 ng/mL

## 2015-01-25 LAB — LACTIC ACID, PLASMA
LACTIC ACID, VENOUS: 2.2 mmol/L — AB (ref 0.5–2.0)
Lactic Acid, Venous: 1.9 mmol/L (ref 0.5–2.0)

## 2015-01-25 LAB — APTT: APTT: 25 s (ref 24–37)

## 2015-01-25 LAB — CREATININE, URINE, RANDOM: CREATININE, URINE: 19.37 mg/dL

## 2015-01-25 LAB — SODIUM, URINE, RANDOM: Sodium, Ur: 15 mmol/L

## 2015-01-25 MED ORDER — SODIUM CHLORIDE 0.9 % IV BOLUS (SEPSIS)
500.0000 mL | Freq: Once | INTRAVENOUS | Status: AC
  Administered 2015-01-25: 500 mL via INTRAVENOUS

## 2015-01-25 MED ORDER — DM-GUAIFENESIN ER 30-600 MG PO TB12
1.0000 | ORAL_TABLET | Freq: Two times a day (BID) | ORAL | Status: DC
  Administered 2015-01-25 – 2015-01-29 (×10): 1 via ORAL
  Filled 2015-01-25 (×10): qty 1

## 2015-01-25 MED ORDER — DIVALPROEX SODIUM 250 MG PO DR TAB
250.0000 mg | DELAYED_RELEASE_TABLET | Freq: Four times a day (QID) | ORAL | Status: DC
  Administered 2015-01-25 – 2015-01-29 (×15): 250 mg via ORAL
  Filled 2015-01-25 (×19): qty 1

## 2015-01-25 MED ORDER — SODIUM CHLORIDE 0.9 % IV SOLN
INTRAVENOUS | Status: DC
  Administered 2015-01-25 – 2015-01-26 (×5): via INTRAVENOUS

## 2015-01-25 MED ORDER — ONDANSETRON HCL 4 MG/2ML IJ SOLN
4.0000 mg | Freq: Three times a day (TID) | INTRAMUSCULAR | Status: DC | PRN
  Administered 2015-01-26 – 2015-01-27 (×2): 4 mg via INTRAVENOUS
  Filled 2015-01-25 (×2): qty 2

## 2015-01-25 MED ORDER — ACETAMINOPHEN 325 MG PO TABS
650.0000 mg | ORAL_TABLET | Freq: Four times a day (QID) | ORAL | Status: DC | PRN
  Administered 2015-01-25: 650 mg via ORAL
  Filled 2015-01-25: qty 2

## 2015-01-25 MED ORDER — SODIUM CHLORIDE 0.9 % IV SOLN
500.0000 mg | Freq: Two times a day (BID) | INTRAVENOUS | Status: DC
  Filled 2015-01-25 (×4): qty 5

## 2015-01-25 MED ORDER — HEPARIN SODIUM (PORCINE) 5000 UNIT/ML IJ SOLN
5000.0000 [IU] | Freq: Three times a day (TID) | INTRAMUSCULAR | Status: DC
  Administered 2015-01-25 – 2015-01-29 (×11): 5000 [IU] via SUBCUTANEOUS
  Filled 2015-01-25 (×11): qty 1

## 2015-01-25 MED ORDER — DEXTROSE 5 % IV SOLN
500.0000 mg | INTRAVENOUS | Status: DC
Start: 2015-01-25 — End: 2015-01-25

## 2015-01-25 MED ORDER — DIVALPROEX SODIUM 250 MG PO DR TAB
250.0000 mg | DELAYED_RELEASE_TABLET | Freq: Four times a day (QID) | ORAL | Status: DC
  Administered 2015-01-25: 250 mg via ORAL
  Filled 2015-01-25 (×5): qty 1

## 2015-01-25 NOTE — Evaluation (Signed)
Physical Therapy Evaluation Patient Details Name: Carl RegalDaniel W Gehret MRN: [id number] DOB: 07/02/1990 Today's Date: 01/25/2015   History of Present Illness  25 y.o. male with PMH of cerebral palsy with mild right-sided weakness, mental retardation, seizure, who presents with fever, cough, shortness of breath and admitted with CAP.  Clinical Impression  Pt admitted with above diagnosis. Pt currently with functional limitations due to the deficits listed below (see PT Problem List).  Pt will benefit from skilled PT to increase their independence and safety with mobility to allow discharge to the venue listed below. Pt ambulated 120' on 2 L/min with 2/4 dyspnea with o2 sats 97% and HR 113. Discussed with family and feel that he does not need any follow up PT at home.     Follow Up Recommendations No PT follow up    Equipment Recommendations  None recommended by PT    Recommendations for Other Services       Precautions / Restrictions Precautions Precautions: None      Mobility  Bed Mobility Overal bed mobility: Needs Assistance Bed Mobility: Supine to Sit     Supine to sit: Supervision;HOB elevated     General bed mobility comments: use of rail to get turned, but no physical A.  Transfers Overall transfer level: Needs assistance Equipment used: None Transfers: Sit to/from Stand Sit to Stand: Supervision            Ambulation/Gait Ambulation/Gait assistance: Min guard Ambulation Distance (Feet): 120 Feet Assistive device: None Gait Pattern/deviations: Wide base of support;Decreased stride length     General Gait Details: Wide BOS with lateral sway.  Family reports this is how he walked prior to admission. Amb on 2 L/min with 2/4 dyspnea with mask on due to droplet precautions. O2 97% and HR 113 after gait  Stairs            Wheelchair Mobility    Modified Rankin (Stroke Patients Only)       Balance Overall balance assessment: Needs  assistance Sitting-balance support: Feet supported Sitting balance-Leahy Scale: Good       Standing balance-Leahy Scale: Good                               Pertinent Vitals/Pain Pain Assessment: No/denies pain    Home Living Family/patient expects to be discharged to:: Private residence Living Arrangements: Parent Available Help at Discharge: Family;Available 24 hours/day Type of Home: House Home Access: Level entry     Home Layout: One level Home Equipment: Grab bars - tub/shower (hiking stick)      Prior Function Level of Independence: Independent         Comments: I with ambulation occasional use of hiking stick when outside     Hand Dominance        Extremity/Trunk Assessment   Upper Extremity Assessment: Defer to OT evaluation           Lower Extremity Assessment: Overall WFL for tasks assessed;Generalized weakness;RLE deficits/detail RLE Deficits / Details: limited ROM due to orthopedic injury, but doesn't majorly impair function       Communication   Communication: No difficulties  Cognition Arousal/Alertness: Awake/alert Behavior During Therapy: WFL for tasks assessed/performed Overall Cognitive Status: History of cognitive impairments - at baseline                      General Comments General comments (skin integrity, edema, etc.): Pt distracts easily  by IV line, o2 tubing, but easy to re-focus.  Family present.    Exercises        Assessment/Plan    PT Assessment Patient needs continued PT services  PT Diagnosis Difficulty walking;Generalized weakness   PT Problem List Decreased strength;Decreased activity tolerance;Decreased mobility  PT Treatment Interventions DME instruction;Gait training;Functional mobility training;Therapeutic activities;Balance training   PT Goals (Current goals can be found in the Care Plan section) Acute Rehab PT Goals Patient Stated Goal: get walking farther/endurance PT Goal  Formulation: With family Time For Goal Achievement: 02/01/15 Potential to Achieve Goals: Good    Frequency Min 3X/week   Barriers to discharge        Co-evaluation               End of Session Equipment Utilized During Treatment: Gait belt;Oxygen Activity Tolerance: Patient limited by fatigue;Patient tolerated treatment well Patient left: in bed;with call bell/phone within reach;with family/visitor present Nurse Communication: Mobility status         Time: 8119-1478 PT Time Calculation (min) (ACUTE ONLY): 30 min   Charges:   PT Evaluation $Initial PT Evaluation Tier I: 1 Procedure (moderate tier) PT Treatments $Gait Training: 8-22 mins   PT G Codes:        Donelda Mailhot LUBECK 01/25/2015, 3:15 PM

## 2015-01-25 NOTE — Evaluation (Signed)
Clinical/Bedside Swallow Evaluation Patient Details  Name: Carl Bryant MRN: [id number] Date of Birth: 05-28-1990  Today's Date: 01/25/2015 Time: SLP Start Time (ACUTE ONLY): 1448 SLP Stop Time (ACUTE ONLY): 1517 SLP Time Calculation (min) (ACUTE ONLY): 29 min  Past Medical History:  Past Medical History  Diagnosis Date  . Seizures (HCC)   . Congenital hemiparesis (HCC)   . Developmental language disorder   . Cerebral palsy [facility])    Past Surgical History:  Past Surgical History  Procedure Laterality Date  . Tendon release  09/2011    right wrist, elbow  . Foot capsule release w/ percutaneous heel cord lengthening, tibial tendon transfer  09/2011    right foot  . Circumcision  19963   HPI:  25 year old male with history of cerebral palsy with right-sided weakness, mental retardation, seizure disorder, now admitted for left lower lobe pneumonia. Mom reports fever, cough, and SOB which started 1 day prior to admission after returning from 5 day cruise in which patient went snorkling. Associated with some nausea and vomiting of phlegm.    Assessment / Plan / Recommendation Clinical Impression  Patient presents with a functional appearing oropharyngeal swallow. Baseline cough noted which slightly increased during po intake although appeared related to coordination of swallow and respirations rather than overt aspiration. Family does report an increase in coughing episodes when "chugging" liquids over the pst 2-3 months which is common for patient given impulsivity. Do not suspect that acute PNA is related to aspiration although diagnostics at bedside challenging given baseline coughing. Discussed with patient and family. Plan made to follow up on 1/3 for continued diagnostics at bedside. Hopeful that baseline cough will have minimized and allow for clearer diagnostic at bedside with po intake.     Aspiration Risk  Mild aspiration risk    Diet Recommendation Regular;Thin liquid    Liquid Administration via: Cup;Straw Medication Administration: Whole meds with liquid Supervision: Patient able to self feed;Full supervision/cueing for compensatory strategies Compensations: Slow rate;Small sips/bites Postural Changes: Seated upright at 90 degrees    Other  Recommendations Oral Care Recommendations: Oral care BID   Follow up Recommendations  None    Frequency and Duration min 1 x/week  1 week        Swallow Study   General HPI: 25 year old male with history of cerebral palsy with right-sided weakness, mental retardation, seizure disorder, now admitted for left lower lobe pneumonia. Mom reports fever, cough, and SOB which started 1 day prior to admission after returning from 5 day cruise in which patient went snorkling. Associated with some nausea and vomiting of phlegm.  Type of Study: Bedside Swallow Evaluation Previous Swallow Assessment: none noted Diet Prior to this Study: Regular;Thin liquids Temperature Spikes Noted: Yes Respiratory Status: Nasal cannula History of Recent Intubation: No Behavior/Cognition: Alert;Cooperative;Pleasant mood Oral Cavity Assessment: Within Functional Limits Oral Care Completed by SLP: No Oral Cavity - Dentition: Dentures, not available Vision: Functional for self-feeding Self-Feeding Abilities: Able to feed self Patient Positioning: Upright in bed Baseline Vocal Quality: Normal Volitional Cough: Strong Volitional Swallow: Able to elicit    Oral/Motor/Sensory Function Overall Oral Motor/Sensory Function: Within functional limits   Ice Chips Ice chips: Not tested   Thin Liquid Thin Liquid: Within functional limits Presentation: Cup;Self Fed;Straw    Nectar Thick Nectar Thick Liquid: Not tested   Honey Thick Honey Thick Liquid: Not tested   Puree Puree: Within functional limits Presentation: Spoon   Solid   GO  Solid: Impaired Presentation: Self Fed Oral Phase Impairments: Impaired mastication (using liquid  wash to aid in transit, baseline per pt/family) Oral Phase Functional Implications: Impaired mastication      Carl Hum MA, CCC-SLP [phone]  Carl Bryant 01/25/2015,3:23 PM

## 2015-01-25 NOTE — H&P (Addendum)
[facility] History and Physical  Carl Bryant ZOX:[id number] DOB: 01-Aug-1990 DOA: 01/24/2015  Referring physician: ED physician PCP: Minda Meo, MD  Specialists:   Chief Complaint: fever, cough and SOB  HPI: Carl Bryant is a 25 y.o. male with PMH of cerebral palsy with mild right-sided weakness, mental retardation, seizure, who presents with fever, cough, shortness of breath.  Patient's mother reported that the patient started having fever, cough and shortness of breath after returned yesterday from a five-day cruise, during which he went snorkeling. He coughs up pink colored sputum. Temperature is up to 103.4 at home. It is associated with some nausea, and vomiting of phlegm. Patient does not have abdominal pain or diarrhea. Patient has mild right-sided weakness due to cerebral palsy, which has not changed. Patient does not have symptoms of UTI, vision change or hearing loss. Patient was initially evaluated in [facility] and had chest x-ray just showed left lower lobe pneumonia, therefore transfered to the emergency room.  In ED, patient was found to have left lower lobe pneumonia by chest x-ray, lactate 3.4--> 2.7--> 2.69, negative urinalysis, WBC 13.8, temperature 103, tachycardia, tachypnea, acute renal injury with creatinine 1.26, BUN 9. Patient is admitted to inpatient for further intervention and treatment.  EKG: Independently reviewed.  Not done in ED, will get one.   Where does patient live?   At home   Can patient participate in ADLs?  Yes  Review of Systems:   General: has fevers, chills, no changes in body weight, has poor appetite, has fatigue HEENT: no blurry vision, hearing changes or sore throat Pulm: has dyspnea, coughing, no wheezing CV: no chest pain, palpitations Abd: has nausea, vomiting, no abdominal pain, diarrhea, constipation GU: no dysuria, burning on urination, increased urinary frequency, hematuria  Ext: no leg edema Neuro: no unilateral  weakness, numbness, or tingling, no vision change or hearing loss Skin: no rash MSK: No muscle spasm, no deformity, no limitation of range of movement in spin Heme: No easy bruising.  Travel history: No recent long distant travel.  Allergy: No Known Allergies  Past Medical History  Diagnosis Date  . Seizures (HCC)   . Congenital hemiparesis (HCC)   . Developmental language disorder   . Cerebral palsy [facility])     Past Surgical History  Procedure Laterality Date  . Tendon release  09/2011    right wrist, elbow  . Foot capsule release w/ percutaneous heel cord lengthening, tibial tendon transfer  09/2011    right foot  . Circumcision  1990    Social History:  reports that he has never smoked. He has never used smokeless tobacco. He reports that he does not drink alcohol or use illicit drugs.  Family History:  Family History  Problem Relation Age of Onset  . Adopted: Yes     Prior to Admission medications   Medication Sig Start Date End Date Taking? Authorizing Provider  acetaminophen (TYLENOL) 325 MG tablet Take 650 mg by mouth every 6 (six) hours as needed for mild pain.    Yes Historical Provider, MD  DEPAKOTE 250 MG DR tablet Take 1 tab by mouth 4 times daily. 12/31/14  Yes Elveria Rising, NP  ibuprofen (ADVIL,MOTRIN) 200 MG tablet Take 200 mg by mouth every 6 (six) hours as needed.   Yes Historical Provider, MD    Physical Exam: Filed Vitals:   01/24/15 2004 01/24/15 2200 01/24/15 2345 01/25/15 0000  BP: 116/68 109/58  120/62  Pulse: 107 101 112 109  Temp: 97.7  F (36.5 C)     TempSrc: Oral     Resp: 20 28  27   Height:      Weight:      SpO2: 95% 95% 95%    General: Not in acute distress HEENT:       Eyes: PERRL, EOMI, no scleral icterus.       ENT: No discharge from the ears and nose, no pharynx injection, no tonsillar enlargement.        Neck: No JVD, no bruit, no mass felt. Heme: No neck lymph node enlargement. Cardiac: S1/S2, RRR, tachycardia, No murmurs,  No gallops or rubs. Pulm: has fine crackles over posterior L lung fields, No wheezing, rhonchi or rubs. Abd: Soft, nondistended, nontender, no rebound pain, no organomegaly, BS present. Ext: No pitting leg edema bilaterally. 2+DP/PT pulse bilaterally. Musculoskeletal: No joint deformities, No joint redness or warmth, no limitation of ROM in spin. Skin: No rashes.  Neuro: Alert, oriented X3, cranial nerves II-XII grossly intact, has mild right sided weakness. Psych: Patient is not psychotic, no suicidal or hemocidal ideation.  Labs on Admission:  Basic Metabolic Panel:  Recent Labs Lab 01/24/15 1639  NA 139  K 4.3  CL 103  CO2 22  GLUCOSE 109*  BUN 9  CREATININE 1.26*  CALCIUM 9.4   Liver Function Tests:  Recent Labs Lab 01/24/15 1639  AST 30  ALT 45  ALKPHOS 65  BILITOT 0.8  PROT 7.1  ALBUMIN 3.3*   No results for input(s): LIPASE, AMYLASE in the last 168 hours. No results for input(s): AMMONIA in the last 168 hours. CBC:  Recent Labs Lab 01/24/15 1639  WBC 13.8*  NEUTROABS 10.4*  HGB 13.5  HCT 39.8  MCV 83.3  PLT 193   Cardiac Enzymes: No results for input(s): CKTOTAL, CKMB, CKMBINDEX, TROPONINI in the last 168 hours.  BNP (last 3 results) No results for input(s): BNP in the last 8760 hours.  ProBNP (last 3 results) No results for input(s): PROBNP in the last 8760 hours.  CBG: No results for input(s): GLUCAP in the last 168 hours.  Radiological Exams on Admission: Dg Chest 2 View  01/24/2015  CLINICAL DATA:  Fever and productive cough. EXAM: CHEST  2 VIEW COMPARISON:  None. FINDINGS: There is an extensive consolidative pneumonia in the posterior superior aspect of the left lower lobe. Prominent bilateral peribronchial thickening. No effusions. Heart size and vascularity are normal. IMPRESSION: Left lower lobe pneumonia.  Bronchitic changes. Electronically Signed   By: Francene Boyers M.D.   On: 01/24/2015 15:29    Assessment/Plan Principal Problem:    CAP (community acquired pneumonia) Active Problems:   Generalized convulsive epilepsy (HCC)   Congenital hemiparesis (HCC)   Developmental language disorder   Sepsis (HCC)   AKI (acute kidney injury) (HCC)   Nausea & vomiting  Sepsis 2/2 to CAP: Patient has pneumonia as shown by chest x-ray. His septic with leukocytosis, fever, tachycardia, tachypnea and elevated lactate. Hemodynamically stable.  - will admit to tele bed - Rocephin and azithromycin - Mucinex for cough  - Urine legionella and S. pneumococcal antigen - Follow up blood culture x2, sputum culture and respiratory virus panel, plus Flu pcr - will get Procalcitonin and trend lactic acid level per sepsis protocol - IVF: 3.5L of NS bolus in ED, followed by 125 mL per hour of NS  Generalized convulsive epilepsy (HCC): -switch oral keppra to IV keppra 500 mg bid since pt can not tolerate oral now  AKI: Likely due  to prerenal secondary to dehydration NSAIDs - IVF as above - Check FeNa - Follow up renal function by BMP - Hold ibuprofen  Nausea and vomiting: Most likely due to sepsis -When necessary Zofran for nausea -Check lipase   DVT ppx: SQ Heparin    Code Status: Full code Family Communication:  Yes, patient's  mother  at bed side Disposition Plan: Admit to inpatient   Date of Service 01/25/2015    Lorretta HarpIU, Minah Axelrod [facility] Pager [phone]  If 7PM-7AM, please contact night-coverage [email or web address] Password [email or web address] 01/25/2015, 12:52 AM

## 2015-01-25 NOTE — Care Management Note (Signed)
Case Management Note  Patient Details  Name: Carl Bryant MRN: [id number] Date of Birth: 10/29/1990  Subjective/Objective:    Date: 01/25/15 Spoke with patient at the bedside along with Mother.  Introduced self as Sports coachcase manager and explained role in discharge planning and how to be reached.  Verified patient lives in town, with mom. Expressed potential need for no other DME.  Verified patient anticipates to go home with family,at time of discharge and will have full-time supervision by family at this time to best of their knowledge.  Mom denied needing help with their medication.  Patient  is driven by mom to MD appointments.  Verified patient has PCP Geoffry Paradiseichard Aronson.  Patient is not active with any HH agency at this time to best of their knowledge.   Plan: CM will continue to follow [facility].                Action/Plan:   Expected Discharge Date:                  Expected Discharge Plan:  Home/Self Care  In-House Referral:     Discharge planning Services  CM Consult  Post Acute Care Choice:    Choice offered to:     DME Arranged:    DME Agency:     HH Arranged:    HH Agency:     Status of Service:  In process, will continue to follow  Medicare Important Message Given:    Date Medicare IM Given:    Medicare IM give by:    Date Additional Medicare IM Given:    Additional Medicare Important Message give by:     If discussed at Long Length of Stay Meetings, dates discussed:    Additional Comments:  Leone Havenaylor, Suleyman Ehrman [place], RN 01/25/2015, 11:38 AM

## 2015-01-25 NOTE — Progress Notes (Signed)
Patient seen and examined  History of cerebral palsy with right-sided weakness, mental retardation, seizure disorder, now admitted for left lower lobe pneumonia  Last documented to be febrile at 1600 on 01/24/68 Continues to be tachycardic, currently requiring 2 L by nasal cannula at 93%  Blood culture no growth so far, influenza panel negative, strep pneumo urine negative, Legionella urine pending, respiratory virus panel pending, lactate improving now 1.9  Plan Continue IV antibiotics, day #1, continue IV fluids at 1 25 mL/h Continue Keppra IV SLP consult

## 2015-01-25 NOTE — Progress Notes (Signed)
[facility] Awaiting patient's arrival to the floor

## 2015-01-26 ENCOUNTER — Inpatient Hospital Stay (HOSPITAL_COMMUNITY): Payer: Medicaid Other

## 2015-01-26 LAB — CBC
HEMATOCRIT: 37.9 % — AB (ref 39.0–52.0)
HEMOGLOBIN: 12.4 g/dL — AB (ref 13.0–17.0)
MCH: 27.6 pg (ref 26.0–34.0)
MCHC: 32.7 g/dL (ref 30.0–36.0)
MCV: 84.2 fL (ref 78.0–100.0)
Platelets: 184 10*3/uL (ref 150–400)
RBC: 4.5 MIL/uL (ref 4.22–5.81)
RDW: 15.7 % — ABNORMAL HIGH (ref 11.5–15.5)
WBC: 17.5 10*3/uL — ABNORMAL HIGH (ref 4.0–10.5)

## 2015-01-26 LAB — COMPREHENSIVE METABOLIC PANEL
ALK PHOS: 96 U/L (ref 38–126)
ALT: 36 U/L (ref 17–63)
ANION GAP: 11 (ref 5–15)
AST: 62 U/L — ABNORMAL HIGH (ref 15–41)
Albumin: 3 g/dL — ABNORMAL LOW (ref 3.5–5.0)
BILIRUBIN TOTAL: 0.4 mg/dL (ref 0.3–1.2)
BUN: <5 mg/dL — ABNORMAL LOW (ref 6–20)
CALCIUM: 8.9 mg/dL (ref 8.9–10.3)
CO2: 23 mmol/L (ref 22–32)
Chloride: 113 mmol/L — ABNORMAL HIGH (ref 101–111)
Creatinine, Ser: 0.78 mg/dL (ref 0.61–1.24)
GFR calc non Af Amer: >60 mL/min (ref >60)
Glucose, Bld: 96 mg/dL (ref 65–99)
Potassium: 3.9 mmol/L (ref 3.5–5.1)
Sodium: 147 mmol/L — ABNORMAL HIGH (ref 135–145)
TOTAL PROTEIN: 6.6 g/dL (ref 6.5–8.1)

## 2015-01-26 LAB — HIV ANTIBODY (ROUTINE TESTING W REFLEX): HIV SCREEN 4TH GENERATION: NONREACTIVE

## 2015-01-26 LAB — URINE CULTURE

## 2015-01-26 LAB — RESPIRATORY VIRUS PANEL
ADENOVIRUS: NEGATIVE
INFLUENZA A: NEGATIVE
INFLUENZA B 1: NEGATIVE
Metapneumovirus: NEGATIVE
PARAINFLUENZA 3 A: NEGATIVE
Parainfluenza 1: NEGATIVE
Parainfluenza 2: NEGATIVE
RESPIRATORY SYNCYTIAL VIRUS A: NEGATIVE
Respiratory Syncytial Virus B: NEGATIVE
Rhinovirus: NEGATIVE

## 2015-01-26 LAB — LEGIONELLA ANTIGEN, URINE

## 2015-01-26 MED ORDER — FLUTICASONE PROPIONATE 50 MCG/ACT NA SUSP
2.0000 | Freq: Every day | NASAL | Status: DC
  Administered 2015-01-26 – 2015-01-29 (×4): 2 via NASAL
  Filled 2015-01-26: qty 16

## 2015-01-26 MED ORDER — CETYLPYRIDINIUM CHLORIDE 0.05 % MT LIQD
7.0000 mL | Freq: Two times a day (BID) | OROMUCOSAL | Status: DC
Start: 2015-01-26 — End: 2015-01-29
  Administered 2015-01-26 – 2015-01-28 (×6): 7 mL via OROMUCOSAL

## 2015-01-26 MED ORDER — CHLORHEXIDINE GLUCONATE 0.12 % MT SOLN
15.0000 mL | Freq: Two times a day (BID) | OROMUCOSAL | Status: DC
  Administered 2015-01-26 – 2015-01-29 (×7): 15 mL via OROMUCOSAL
  Filled 2015-01-26 (×7): qty 15

## 2015-01-26 MED ORDER — PIPERACILLIN-TAZOBACTAM 3.375 G IVPB
3.3750 g | Freq: Three times a day (TID) | INTRAVENOUS | Status: DC
  Administered 2015-01-26 – 2015-01-28 (×6): 3.375 g via INTRAVENOUS
  Filled 2015-01-26 (×8): qty 50

## 2015-01-26 MED ORDER — SODIUM CHLORIDE 0.45 % IV SOLN
INTRAVENOUS | Status: DC
Start: 2015-01-26 — End: 2015-01-28
  Administered 2015-01-26 – 2015-01-28 (×2): via INTRAVENOUS

## 2015-01-26 NOTE — Progress Notes (Signed)
SATURATION QUALIFICATIONS: (This note is used to comply with regulatory documentation for home oxygen)  Patient Saturations on Room Air at Rest = 95%  Patient Saturations on Room Air while Ambulating = 82%  Clydie BraunKaren L. Katrinka BlazingSmith, [place]PT Pager [phone] 01/26/2015

## 2015-01-26 NOTE — Progress Notes (Signed)
[facility] PROGRESS NOTE  Carl Bryant ZOX:[id number] DOB: 02/05/1990 DOA: 01/24/2015 PCP: Minda Meo, MD  Length of stay: 1   Assessment/Plan: Principal Problem:   CAP (community acquired pneumonia) Active Problems:   Generalized convulsive epilepsy (HCC)   Congenital hemiparesis (HCC)   Developmental language disorder   Sepsis (HCC)   AKI (acute kidney injury) (HCC)   Nausea & vomiting    Carl Bryant is a 25 y.o. male with PMH of cerebral palsy with mild right-sided weakness, mental retardation, seizure, who presents with fever, cough, shortness of breath. Patient found to have left lower lobe pneumonia, now bacteremic with gram-negative rods. In ED, patient was found to have left lower lobe pneumonia by chest x-ray, lactate 3.4--> 2.7--> 2.69, negative urinalysis, WBC 13.8, temperature 103, tachycardia, tachypnea, acute renal injury with creatinine 1.26, BUN 9. Patient is admitted to inpatient for further intervention and treatment.   Assessment and plan Sepsis 2/2 to CAP: Patient has pneumonia as shown by chest x-ray. Gram-negative rods on blood culture, afebrile today, continues to appear clammy, tachycardia, tachypnea , improving lactic acid. Hemodynamically stable. Continue telemetry - Was started on  Rocephin and azithromycin day #2, blood culture positive speciation and sensitivity pending - Mucinex for cough  - Urine legionella and S. pneumococcal antigen - Follow up blood culture x2, sputum culture and respiratory virus panel, plus Flu pcr Repeat chest x-ray due to worsening white count , shows worsening patchy consolidation with progressive multilobar pneumonia, radiologist recommends repeat chest x-ray in 6-8 weeks Due to worsening leukocytosis will start patient on Zosyn and discontinue Rocephin and azithromycin  Hypernatremia Changed IV fluids to half normal saline   Generalized convulsive epilepsy [facility]): Patient to be continued on  Depakote  AKI: Likely due to prerenal secondary to dehydration NSAIDs - IVF as above - Check FeNa - Follow up renal function by BMP - Hold ibuprofen  Nausea and vomiting: Most likely due to sepsis -When necessary Zofran for nausea -Check lipase    DVT prophylaxsis heparin  Code Status:      Code Status Orders        Start     Ordered   01/25/15 0024  Full code   Continuous     01/25/15 0024    Advance Directive Documentation        Most Recent Value   Type of Advance Directive  Healthcare Power of Attorney   Pre-existing out of facility DNR order (yellow form or pink MOST form)     "MOST" Form in Place?        Family Communication: Discussed in detail with the patient, all imaging results, lab results explained to the patient   Disposition Plan:  Anticipate discharge in 2-3 days      Consultants:  None  Procedures:  None  Antibiotics: Anti-infectives    Start     Dose/Rate Route Frequency Ordered Stop   01/25/15 2200  azithromycin (ZITHROMAX) 500 mg in dextrose 5 % 250 mL IVPB     500 mg 250 mL/hr over 60 Minutes Intravenous Every 24 hours 01/24/15 2128     01/25/15 2100  cefTRIAXone (ROCEPHIN) 1 g in dextrose 5 % 50 mL IVPB     1 g 100 mL/hr over 30 Minutes Intravenous Every 24 hours 01/24/15 2128     01/25/15 0030  azithromycin (ZITHROMAX) 500 mg in dextrose 5 % 250 mL IVPB  Status:  Discontinued     500 mg 250  mL/hr over 60 Minutes Intravenous Every 24 hours 01/25/15 0023 01/25/15 0108   01/24/15 2115  cefTRIAXone (ROCEPHIN) 1 g in dextrose 5 % 50 mL IVPB     1 g 100 mL/hr over 30 Minutes Intravenous  Once 01/24/15 2111 01/24/15 2235   01/24/15 2115  azithromycin (ZITHROMAX) 500 mg in dextrose 5 % 250 mL IVPB     500 mg 250 mL/hr over 60 Minutes Intravenous  Once 01/24/15 2111 01/24/15 2346         HPI/Subjective: According to the father who is by the bedside, patient is improving slowly, still continues to have postnasal drip and  nonproductive cough   Objective: Filed Vitals:   01/25/15 1742 01/25/15 1801 01/25/15 2225 01/26/15 0539  BP:   140/66 145/90  Pulse:   78 80  Temp: 98.4 F (36.9 C) 99.8 F (37.7 C) 98.2 F (36.8 C) 97.9 F (36.6 C)  TempSrc: Axillary Rectal Oral Oral  Resp:   18 16  Height:      Weight:      SpO2:   95% 94%    Intake/Output Summary (Last 24 hours) at 01/26/15 1326 Last data filed at 01/26/15 1113  Gross per 24 hour  Intake   2020 ml  Output   1050 ml  Net    970 ml    Exam:  General: No acute respiratory distress Lungs: Clear to auscultation bilaterally without wheezes or crackles Cardiovascular: Regular rate and rhythm without murmur gallop or rub normal S1 and S2 Abdomen: Nontender, nondistended, soft, bowel sounds positive, no rebound, no ascites, no appreciable mass Extremities: No significant cyanosis, clubbing, or edema bilateral lower extremities     Data Review   Micro Results Recent Results (from the past 240 hour(s))  Urine culture     Status: None   Collection Time: 01/24/15  4:34 PM  Result Value Ref Range Status   Specimen Description URINE, CLEAN CATCH  Final   Special Requests NONE  Final   Culture MULTIPLE SPECIES PRESENT, SUGGEST RECOLLECTION  Final   Report Status 01/26/2015 FINAL  Final  Culture, blood (routine x 2)     Status: None (Preliminary result)   Collection Time: 01/24/15  4:41 PM  Result Value Ref Range Status   Specimen Description BLOOD LEFT ANTECUBITAL  Final   Special Requests BOTTLES DRAWN AEROBIC AND ANAEROBIC 10CC  Final   Culture  Setup Time   Final    GRAM NEGATIVE RODS AEROBIC BOTTLE ONLY CRITICAL RESULT CALLED TO, READ BACK BY AND VERIFIED WITH: N OSEI @0200  01/26/15 MKELLY    Culture NO GROWTH < 24 HOURS  Final   Report Status PENDING  Incomplete    Radiology Reports Dg Chest 2 View  01/26/2015  CLINICAL DATA:  Community-acquired pneumonia EXAM: CHEST  2 VIEW COMPARISON:  01/24/2015 chest radiograph. FINDINGS:  Slightly right rotated AP view. Stable cardiomediastinal silhouette with normal heart size. No pneumothorax. No pleural effusion. There is extensive patchy consolidation throughout the parahilar left lung, increased. No right lung opacity. IMPRESSION: Increased extensive patchy consolidation throughout the parahilar left lung, most in keeping with progressive multilobar pneumonia. Recommend follow-up PA and lateral post treatment chest radiographs in 6-8 weeks. Electronically Signed   By: Delbert Phenix M.D.   On: 01/26/2015 11:19   Dg Chest 2 View  01/24/2015  CLINICAL DATA:  Fever and productive cough. EXAM: CHEST  2 VIEW COMPARISON:  None. FINDINGS: There is an extensive consolidative pneumonia in the posterior superior aspect of  the left lower lobe. Prominent bilateral peribronchial thickening. No effusions. Heart size and vascularity are normal. IMPRESSION: Left lower lobe pneumonia.  Bronchitic changes. Electronically Signed   By: Francene BoyersJames  Maxwell M.D.   On: 01/24/2015 15:29     CBC  Recent Labs Lab 01/24/15 1639 01/26/15 0745  WBC 13.8* 17.5*  HGB 13.5 12.4*  HCT 39.8 37.9*  PLT 193 184  MCV 83.3 84.2  MCH 28.2 27.6  MCHC 33.9 32.7  RDW 15.3 15.7*  LYMPHSABS 1.3  --   MONOABS 2.1*  --   EOSABS 0.0  --   BASOSABS 0.0  --     Chemistries   Recent Labs Lab 01/24/15 1639 01/26/15 0745  NA 139 147*  K 4.3 3.9  CL 103 113*  CO2 22 23  GLUCOSE 109* 96  BUN 9 <5*  CREATININE 1.26* 0.78  CALCIUM 9.4 8.9  AST 30 62*  ALT 45 36  ALKPHOS 65 96  BILITOT 0.8 0.4   ------------------------------------------------------------------------------------------------------------------ estimated creatinine clearance is 151.6 mL/min (by C-G formula based on Cr of 0.78). ------------------------------------------------------------------------------------------------------------------ No results for input(s): HGBA1C in the last 72  hours. ------------------------------------------------------------------------------------------------------------------ No results for input(s): CHOL, HDL, LDLCALC, TRIG, CHOLHDL, LDLDIRECT in the last 72 hours. ------------------------------------------------------------------------------------------------------------------ No results for input(s): TSH, T4TOTAL, T3FREE, THYROIDAB in the last 72 hours.  Invalid input(s): FREET3 ------------------------------------------------------------------------------------------------------------------ No results for input(s): VITAMINB12, FOLATE, FERRITIN, TIBC, IRON, RETICCTPCT in the last 72 hours.  Coagulation profile  Recent Labs Lab 01/25/15 0207  INR 1.39    No results for input(s): DDIMER in the last 72 hours.  Cardiac Enzymes No results for input(s): CKMB, TROPONINI, MYOGLOBIN in the last 168 hours.  Invalid input(s): CK ------------------------------------------------------------------------------------------------------------------ Invalid input(s): POCBNP   CBG: No results for input(s): GLUCAP in the last 168 hours.     Studies: Dg Chest 2 View  01/26/2015  CLINICAL DATA:  Community-acquired pneumonia EXAM: CHEST  2 VIEW COMPARISON:  01/24/2015 chest radiograph. FINDINGS: Slightly right rotated AP view. Stable cardiomediastinal silhouette with normal heart size. No pneumothorax. No pleural effusion. There is extensive patchy consolidation throughout the parahilar left lung, increased. No right lung opacity. IMPRESSION: Increased extensive patchy consolidation throughout the parahilar left lung, most in keeping with progressive multilobar pneumonia. Recommend follow-up PA and lateral post treatment chest radiographs in 6-8 weeks. Electronically Signed   By: Delbert PhenixJason A Poff M.D.   On: 01/26/2015 11:19   Dg Chest 2 View  01/24/2015  CLINICAL DATA:  Fever and productive cough. EXAM: CHEST  2 VIEW COMPARISON:  None. FINDINGS: There is  an extensive consolidative pneumonia in the posterior superior aspect of the left lower lobe. Prominent bilateral peribronchial thickening. No effusions. Heart size and vascularity are normal. IMPRESSION: Left lower lobe pneumonia.  Bronchitic changes. Electronically Signed   By: Francene BoyersJames  Maxwell M.D.   On: 01/24/2015 15:29      No results found for: HGBA1C Lab Results  Component Value Date   CREATININE 0.78 01/26/2015       Scheduled Meds: . antiseptic oral rinse  7 mL Mouth Rinse q12n4p  . azithromycin  500 mg Intravenous Q24H  . cefTRIAXone (ROCEPHIN)  IV  1 g Intravenous Q24H  . chlorhexidine  15 mL Mouth Rinse BID  . dextromethorphan-guaiFENesin  1 tablet Oral BID  . divalproex  250 mg Oral Q6H  . fluticasone  2 spray Each Nare Daily  . heparin  5,000 Units Subcutaneous 3 times per day   Continuous Infusions: . sodium chloride 100 mL/hr at  01/26/15 1022    Principal Problem:   CAP (community acquired pneumonia) Active Problems:   Generalized convulsive epilepsy (HCC)   Congenital hemiparesis (HCC)   Developmental language disorder   Sepsis (HCC)   AKI (acute kidney injury) (HCC)   Nausea & vomiting    Time spent: 45 minutes   [facility]  [facility] Pager [phone]. If 7PM-7AM, please contact night-coverage at [email or web address], password [facility] 01/26/2015, 1:26 PM  LOS: 1 day

## 2015-01-26 NOTE — Progress Notes (Signed)
Physical Therapy Treatment Patient Details Name: Carl Bryant MRN: [id number] DOB: 1990/11/27 Today's Date: 01/26/2015    History of Present Illness 25 y.o. male with PMH of cerebral palsy with mild right-sided weakness, mental retardation, seizure, who presents with fever, cough, shortness of breath and admitted with CAP.    PT Comments    Pt's o2 on RA 95-98% and was okayed by nursing to get ambulation o2 sat on RA.  After 7460' he de-sat to 82% with HR 125. Pt does seems to change the way he breathes when having to wear the mask in the hallway.  Did not ambulate on o2 due to transport there to take to x-ray.  o2 reapplied.  Follow Up Recommendations  No PT follow up     Equipment Recommendations  None recommended by PT    Recommendations for Other Services       Precautions / Restrictions Precautions Precautions: None Restrictions Weight Bearing Restrictions: No    Mobility  Bed Mobility Overal bed mobility: Modified Independent Bed Mobility: Supine to Sit     Supine to sit: Modified independent (Device/Increase time)     General bed mobility comments: bed flat with rail, o2 on RA 95-98%  Transfers Overall transfer level: Needs assistance Equipment used: None Transfers: Sit to/from Stand Sit to Stand: Supervision            Ambulation/Gait Ambulation/Gait assistance: Min guard Ambulation Distance (Feet): 60 Feet Assistive device: None Gait Pattern/deviations: Wide base of support;Decreased stride length Gait velocity: decreased   General Gait Details: Wide BOS with lateral sway. Amb with mask on without o2.  Pt's breathing labored, although he felt it was because of the mask. Checked vitals and o2 de-sat to 82% and HR up to 125 after 60' of gait on RA.  Transport in hallway to take to x-ray and he sat in the w/c and re-applied o2 with nursing present.   Stairs            Wheelchair Mobility    Modified Rankin (Stroke Patients Only)        Balance   Sitting-balance support: Feet supported Sitting balance-Leahy Scale: Good       Standing balance-Leahy Scale: Good                      Cognition Arousal/Alertness: Awake/alert Behavior During Therapy: WFL for tasks assessed/performed Overall Cognitive Status: History of cognitive impairments - at baseline                      Exercises      General Comments General comments (skin integrity, edema, etc.): Pt does not like the mask and almost seems to change breathing pattern because of it.      Pertinent Vitals/Pain Pain Assessment: No/denies pain    Home Living                      Prior Function            PT Goals (current goals can now be found in the care plan section) Acute Rehab PT Goals Patient Stated Goal: get walking farther/endurance PT Goal Formulation: With family Time For Goal Achievement: 02/01/15 Potential to Achieve Goals: Good Progress towards PT goals: Not progressing toward goals - comment (de-sat on RA)    Frequency  Min 3X/week    PT Plan Current plan remains appropriate    Co-evaluation  End of Session Equipment Utilized During Treatment: Gait belt;Oxygen Activity Tolerance: Patient limited by fatigue;Treatment limited secondary to medical complications (Comment) (elevated HR and decreased o2 on RA) Patient left: Other (comment) (in w/c with transport and nursing and sister present)     Time: 1610-9604 PT Time Calculation (min) (ACUTE ONLY): 14 min  Charges:  $Gait Training: 8-22 mins                    G Codes:      Carl Bryant 01/26/2015, 11:40 AM

## 2015-01-26 NOTE — Progress Notes (Signed)
ANTIBIOTIC CONSULT NOTE - INITIAL  Pharmacy Consult for Zosyn Indication: pneumonia; blood cx 1/2 GNR  No Known Allergies  Patient Measurements: Height: 5\' 7"  (170.2 cm) Weight: 203 lb 11.3 oz (92.4 kg) IBW/kg (Calculated) : 66.1  Vital Signs: Temp: 97.9 F (36.6 C) (01/03 0539) Temp Source: Oral (01/03 0539) BP: 145/90 mmHg (01/03 0539) Pulse Rate: 80 (01/03 0539) Intake/Output from previous day: 01/02 0701 - 01/03 0700 In: 1774.6 [P.O.:460; I.V.:1314.6] Out: 1200 [Urine:1200] Intake/Output from this shift: Total I/O In: 360 [P.O.:360] Out: 650 [Urine:650]  Labs:  Recent Labs  01/24/15 1639 01/25/15 0317 01/26/15 0745  WBC 13.8*  --  17.5*  HGB 13.5  --  12.4*  PLT 193  --  184  LABCREA  --  19.37  --   CREATININE 1.26*  --  0.78   Estimated Creatinine Clearance: 151.6 mL/min (by C-G formula based on Cr of 0.78). No results for input(s): VANCOTROUGH, VANCOPEAK, VANCORANDOM, GENTTROUGH, GENTPEAK, GENTRANDOM, TOBRATROUGH, TOBRAPEAK, TOBRARND, AMIKACINPEAK, AMIKACINTROU, AMIKACIN in the last 72 hours.   Microbiology: Recent Results (from the past 720 hour(s))  Urine culture     Status: None   Collection Time: 01/24/15  4:34 PM  Result Value Ref Range Status   Specimen Description URINE, CLEAN CATCH  Final   Special Requests NONE  Final   Culture MULTIPLE SPECIES PRESENT, SUGGEST RECOLLECTION  Final   Report Status 01/26/2015 FINAL  Final  Culture, blood (routine x 2)     Status: None (Preliminary result)   Collection Time: 01/24/15  4:41 PM  Result Value Ref Range Status   Specimen Description BLOOD LEFT ANTECUBITAL  Final   Special Requests BOTTLES DRAWN AEROBIC AND ANAEROBIC 10CC  Final   Culture  Setup Time   Final    GRAM NEGATIVE RODS AEROBIC BOTTLE ONLY CRITICAL RESULT CALLED TO, READ BACK BY AND VERIFIED WITH: N OSEI @0200  01/26/15 MKELLY    Culture NO GROWTH 2 DAYS  Final   Report Status PENDING  Incomplete  Culture, blood (routine x 2)      Status: None (Preliminary result)   Collection Time: 01/25/15  2:00 AM  Result Value Ref Range Status   Specimen Description BLOOD RIGHT HAND  Final   Special Requests IN PEDIATRIC BOTTLE 3CC  Final   Culture NO GROWTH 1 DAY  Final   Report Status PENDING  Incomplete    Medical History: Past Medical History  Diagnosis Date  . Seizures (HCC)   . Congenital hemiparesis (HCC)   . Developmental language disorder   . Cerebral palsy (HCC)     Medications:  Scheduled:  . antiseptic oral rinse  7 mL Mouth Rinse q12n4p  . chlorhexidine  15 mL Mouth Rinse BID  . dextromethorphan-guaiFENesin  1 tablet Oral BID  . divalproex  250 mg Oral Q6H  . fluticasone  2 spray Each Nare Daily  . heparin  5,000 Units Subcutaneous 3 times per day   Assessment: 25 yo M with cerebral palsy with right-sided weakness, mental retardation, seizure disorder, now admitted for left lower lobe pneumonia.  Initially started on Rocephin/Azithro but WBC has increased and blood cx 1/2 GNR.  To change abx to Zosyn.  Goal of Therapy:  Eradication of Infection  Plan:  Zosyn 3.375 gm IV q8h (4 hour infusion). Follow-up cx data, renal function, and clinical progress.  Toys 'R' UsKimberly Leota Maka, Pharm.D., BCPS Clinical Pharmacist Pager [phone] 01/26/2015 2:13 PM

## 2015-01-26 NOTE — Progress Notes (Signed)
Speech Language Pathology Treatment: Dysphagia  Patient Details Name: Carl Bryant MRN: [id number] DOB: 02-14-1990 Today's Date: 01/26/2015 Time: 3888-2800 SLP Time Calculation (min) (ACUTE ONLY): 12 min  Assessment / Plan / Recommendation Clinical Impression  Skilled treatment session focused on addressing dysphagia goals for continued diagnostic treatment of swallow function.  Upon SLP arrival patient was consuming lunch of soft solids (patient's preference) and thin liquids via cup and straw.  Patient demonstrated an intermittent cough 2-3 times during skilled observation; however, there was no pattern to indicate relation to PO intake.  Patient and family was educated on recommendations for small portions and a slow pace with intermittent cues needed for carryover.  Patient has level of support needed for carryover.  Goals met and no further skilled SLP services are warranted at this time. SLP signing off, family in agreement.       HPI HPI: 25 year old male with history of cerebral palsy with right-sided weakness, mental retardation, seizure disorder, now admitted for left lower lobe pneumonia. Mom reports fever, cough, and SOB which started 1 day prior to admission after returning from 5 day cruise in which patient went snorkling. Associated with some nausea and vomiting of phlegm.       SLP Plan  All goals met     Recommendations  Diet recommendations: Regular;Thin liquid Liquids provided via: Cup;Straw Medication Administration: Whole meds with liquid Supervision: Patient able to self feed;Intermittent supervision to cue for compensatory strategies Compensations: Slow rate;Small sips/bites Postural Changes and/or Swallow Maneuvers: Seated upright 90 degrees              Oral Care Recommendations: Oral care BID Follow up Recommendations: None Plan: All goals met  Carmelia Roller., CCC-SLP [phone]  Carl Bryant 01/26/2015, 1:48 PM

## 2015-01-26 NOTE — Evaluation (Signed)
Occupational Therapy Evaluation Patient Details Name: Carl Bryant MRN: [id number] DOB: 1990/05/09 Today's Date: 01/26/2015    History of Present Illness 25 y.o. male with PMH of cerebral palsy with mild right-sided weakness, mental retardation, seizure, who presents with fever, cough, shortness of breath and admitted with CAP.   Clinical Impression   Pt currently needs min to max assist for selfcare tasks secondary to having IVs in his functional LUE.  Family is able to provide assist at current level and feel he will be back at baseline level of supervision to modified independence once the IVs are removed.  Oxygen sats 92% on room air at rest but decreasing to 79% on room air with functional mobility.  Took approximately 1 min to increase back to 92% on room air with rest.  No further OT needs at this time.  Pt will have 24 hour supervision at discharge, recommend use of shower seat for energy conservation at home.  Family voices understanding.      Follow Up Recommendations  No OT follow up    Equipment Recommendations  Tub/shower seat       Precautions / Restrictions Precautions Precautions: Other (comment) Precaution Comments: history of CP, limited use of the RUE and hand Restrictions Weight Bearing Restrictions: No      Mobility Bed Mobility Overal bed mobility: Modified Independent Bed Mobility: Supine to Sit     Supine to sit: Modified independent (Device/Increase time)        Transfers Overall transfer level: Needs assistance Equipment used: None Transfers: Sit to/from Stand Sit to Stand: Supervision              Balance Overall balance assessment: Needs assistance   Sitting balance-Leahy Scale: Good       Standing balance-Leahy Scale: Good Standing balance comment: Supervision for dynamic balance with mobility.                            ADL Overall ADL's : Needs assistance/impaired Eating/Feeding: Modified  independent;Sitting   Grooming: Wash/dry hands;Wash/dry face;Supervision/safety;Sitting   Upper Body Bathing: Minimal assitance;Sitting   Lower Body Bathing: Supervison/ safety;Sit to/from stand   Upper Body Dressing : Minimal assistance;Sitting   Lower Body Dressing: Minimal assistance;Sit to/from stand   Toilet Transfer: Supervision/safety;BSC;Ambulation   Toileting- Architect and Hygiene: Maximal assistance;Sit to/from stand       Functional mobility during ADLs: Supervision/safety General ADL Comments: Pt requires assistance for all aspects of toileting and dressing secondary to having two IVs in his functional LUE.  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  Family and pt report that he is able to complete most selfcare tasks without assistance and could even get down and up from the tub to take a bath when he chose this instead of a shower.  Oxygen sats 92% at rest but decreasing to 79% with mobility in the hallway when wearing a mask.  Sats increased to 92% with short rest break in sitting of less than 1 minute on room air.  Recommended initial supervision and assistance for selfcare tasks at home as well as use of a shower seat to help conserve energy.       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 LUE Deficits / Details: Functional use currently limited secondary to multiple IVs in his hand and elbow.  Unable to use as normal secondary to this LUE:  Unable to fully assess due to immobilization   Lower Extremity Assessment Lower Extremity Assessment: Defer to PT evaluation   Cervical / Trunk Assessment Cervical / Trunk Assessment: Kyphotic   Communication Communication Communication: No difficulties   Cognition Arousal/Alertness: Awake/alert Behavior During Therapy: WFL for tasks assessed/performed Overall Cognitive Status: History of cognitive impairments - at baseline                                Home Living Family/patient expects to be discharged to:: Private residence Living Arrangements: Parent Available Help at Discharge: Family;Available 24 hours/day Type of Home: House Home Access: Level entry     Home Layout: One level     Bathroom Shower/Tub: Producer, television/film/videoWalk-in shower   Bathroom Toilet: Standard Bathroom Accessibility: Yes   Home Equipment: Grab bars - tub/shower (hiking stick)          Prior Functioning/Environment Level of Independence: Independent        Comments: I with ambulation occasional use of hiking stick when outside                              End of Session Nurse Communication: Mobility status  Activity Tolerance: Patient limited by fatigue Patient left: in bed;with call bell/phone within reach;with family/visitor present   Time: 1452-1520 OT Time Calculation (min): 28 min Charges:  OT General Charges $OT Visit: 1 Procedure OT Evaluation $OT Eval Low Complexity: 1 Procedure OT Treatments $Self Care/Home Management : 8-22 mins  Frank Pilger OTR/L 01/26/2015, 3:38 PM

## 2015-01-27 ENCOUNTER — Inpatient Hospital Stay (HOSPITAL_COMMUNITY): Payer: Medicaid Other

## 2015-01-27 DIAGNOSIS — J189 Pneumonia, unspecified organism: Secondary | ICD-10-CM

## 2015-01-27 DIAGNOSIS — R112 Nausea with vomiting, unspecified: Secondary | ICD-10-CM

## 2015-01-27 DIAGNOSIS — A419 Sepsis, unspecified organism: Secondary | ICD-10-CM

## 2015-01-27 LAB — COMPREHENSIVE METABOLIC PANEL
ALT: 46 U/L (ref 17–63)
AST: 89 U/L — AB (ref 15–41)
Albumin: 2.6 g/dL — ABNORMAL LOW (ref 3.5–5.0)
Alkaline Phosphatase: 70 U/L (ref 38–126)
Anion gap: 12 (ref 5–15)
BILIRUBIN TOTAL: 0.3 mg/dL (ref 0.3–1.2)
CHLORIDE: 110 mmol/L (ref 101–111)
CO2: 26 mmol/L (ref 22–32)
CREATININE: 0.58 mg/dL — AB (ref 0.61–1.24)
Calcium: 8.5 mg/dL — ABNORMAL LOW (ref 8.9–10.3)
Glucose, Bld: 107 mg/dL — ABNORMAL HIGH (ref 65–99)
POTASSIUM: 3.5 mmol/L (ref 3.5–5.1)
Sodium: 148 mmol/L — ABNORMAL HIGH (ref 135–145)
TOTAL PROTEIN: 6.5 g/dL (ref 6.5–8.1)

## 2015-01-27 LAB — CBC
HEMATOCRIT: 39.3 % (ref 39.0–52.0)
Hemoglobin: 13.3 g/dL (ref 13.0–17.0)
MCH: 28.1 pg (ref 26.0–34.0)
MCHC: 33.8 g/dL (ref 30.0–36.0)
MCV: 82.9 fL (ref 78.0–100.0)
PLATELETS: 212 10*3/uL (ref 150–400)
RBC: 4.74 MIL/uL (ref 4.22–5.81)
RDW: 15.3 % (ref 11.5–15.5)
WBC: 17.6 10*3/uL — AB (ref 4.0–10.5)

## 2015-01-27 NOTE — Progress Notes (Signed)
SATURATION QUALIFICATIONS: (This note is used to comply with regulatory documentation for home oxygen)  Patient Saturations on Room Air at Rest = 92%  Patient Saturations on Room Air while Ambulating = 85%  Patient Saturations on 1 Liters of oxygen while Ambulating = 92%  Please briefly explain why patient needs home oxygen: Pt. Needs 1L of oxygen for home at this time. Should be rechecked before discharge

## 2015-01-27 NOTE — Progress Notes (Signed)
[facility] PROGRESS NOTE  Carl Bryant WJX:[id number] DOB: October 02, 1990 DOA: 01/24/2015 PCP: Minda Meo, MD  Length of stay: 2   Assessment/Plan: Principal Problem:   CAP (community acquired pneumonia) Active Problems:   Generalized convulsive epilepsy (HCC)   Congenital hemiparesis (HCC)   Developmental language disorder   Sepsis (HCC)   AKI (acute kidney injury) (HCC)   Nausea & vomiting   Brief HPI Carl Bryant is a 25 y.o. male with PMH of cerebral palsy with mild right-sided weakness, mental retardation, seizure, who presented with fever, cough, shortness of breath. Patient found to have left lower lobe pneumonia, now bacteremic with gram-negative rods.  Assessment and plan Sepsis 2/2 to CAP Patient has pneumonia as shown by chest x-ray. Gram-negative rods on blood culture. Patient appears to be better this morning. Await final identification and sensitivities. Patient was initially started on ceftriaxone and azithromycin. Changed to Zosyn. Urine legionella and S. pneumococcal antigen were both negative. Influenza PCR is negative as well. Respiratory viral panel was negative. HIV is negative. Repeat chest x-ray due to worsening white count , shows worsening patchy consolidation with progressive multilobar pneumonia, radiologist recommends repeat chest x-ray in 6-8 weeks. The PVCs stable compared to yesterday.  Nausea and vomiting Patient continues to have vomiting. Thought to be secondary to the above. Abdomen is benign. Lipase is normal. Proceed with abdominal films. AST noted to be mildly elevated. Proceed with ultrasound of the abdomen as well.  Hypernatremia IV fluids was changed over to half normal saline yesterday. Sodium level is stable. Continue to monitor.   Generalized convulsive epilepsy Patient to be continued on Depakote  AKI Likely due to prerenal secondary to dehydration NSAIDs - IVF as above Renal function has improved.    DVT  prophylaxsis heparin  Code Status:      Code Status Orders        Start     Ordered   01/25/15 0024  Full code   Continuous     01/25/15 0024    Advance Directive Documentation        Most Recent Value   Type of Advance Directive  Healthcare Power of Attorney   Pre-existing out of facility DNR order (yellow form or pink MOST form)     "MOST" Form in Place?        Family Communication: Discussed with the patient and his mother  Disposition Plan:  Anticipate discharge in 2-3 days   Consultants:  None  Procedures:  None  Antibiotics: Anti-infectives    Start     Dose/Rate Route Frequency Ordered Stop   01/26/15 1415  piperacillin-tazobactam (ZOSYN) IVPB 3.375 g     3.375 g 12.5 mL/hr over 240 Minutes Intravenous 3 times per day 01/26/15 1414     01/25/15 2200  azithromycin (ZITHROMAX) 500 mg in dextrose 5 % 250 mL IVPB  Status:  Discontinued     500 mg 250 mL/hr over 60 Minutes Intravenous Every 24 hours 01/24/15 2128 01/26/15 1410   01/25/15 2100  cefTRIAXone (ROCEPHIN) 1 g in dextrose 5 % 50 mL IVPB  Status:  Discontinued     1 g 100 mL/hr over 30 Minutes Intravenous Every 24 hours 01/24/15 2128 01/26/15 1332   01/25/15 0030  azithromycin (ZITHROMAX) 500 mg in dextrose 5 % 250 mL IVPB  Status:  Discontinued     500 mg 250 mL/hr over 60 Minutes Intravenous Every 24 hours 01/25/15 0023 01/25/15 0108   01/24/15 2115  cefTRIAXone (ROCEPHIN) 1 g in dextrose 5 % 50 mL IVPB     1 g 100 mL/hr over 30 Minutes Intravenous  Once 01/24/15 2111 01/24/15 2235   01/24/15 2115  azithromycin (ZITHROMAX) 500 mg in dextrose 5 % 250 mL IVPB     500 mg 250 mL/hr over 60 Minutes Intravenous  Once 01/24/15 2111 01/24/15 2346         Subjective: Patient continues to feel short of breath. Continues to have vomiting. Denies any chest pain. Some discomfort in his abdomen is present. His mother is at the bedside.   Objective: Filed Vitals:   01/26/15 2127 01/26/15 2130 01/27/15  0523 01/27/15 0525  BP: 150/92 137/83 150/88 141/89  Pulse: 81 81 75 73  Temp: 98 F (36.7 C)  98.1 F (36.7 C)   TempSrc: Oral  Oral   Resp: 20  18   Height:      Weight:      SpO2: 92%  93%     Intake/Output Summary (Last 24 hours) at 01/27/15 1022 Last data filed at 01/27/15 0900  Gross per 24 hour  Intake   1465 ml  Output   1650 ml  Net   -185 ml    Exam:  General: No acute distress Lungs: Ankles, present bilateral bases. No wheezing. Cardiovascular: Regular rate and rhythm without murmur gallop or rub normal S1 and S2 Abdomen: Abdomen is soft. Mild, vague tenderness present in the epigastric and right upper quadrant areas. No rebound, rigidity or guarding. Extremities: No significant cyanosis, clubbing, or edema bilateral lower extremities   Data Review   Micro Results Recent Results (from the past 240 hour(s))  Urine culture     Status: None   Collection Time: 01/24/15  4:34 PM  Result Value Ref Range Status   Specimen Description URINE, CLEAN CATCH  Final   Special Requests NONE  Final   Culture MULTIPLE SPECIES PRESENT, SUGGEST RECOLLECTION  Final   Report Status 01/26/2015 FINAL  Final  Culture, blood (routine x 2)     Status: None (Preliminary result)   Collection Time: 01/24/15  4:41 PM  Result Value Ref Range Status   Specimen Description BLOOD LEFT ANTECUBITAL  Final   Special Requests BOTTLES DRAWN AEROBIC AND ANAEROBIC 10CC  Final   Culture  Setup Time   Final    GRAM NEGATIVE RODS AEROBIC BOTTLE ONLY CRITICAL RESULT CALLED TO, READ BACK BY AND VERIFIED WITH: N OSEI @0200  01/26/15 MKELLY    Culture NO GROWTH 2 DAYS  Final   Report Status PENDING  Incomplete  Culture, blood (routine x 2)     Status: None (Preliminary result)   Collection Time: 01/25/15  2:00 AM  Result Value Ref Range Status   Specimen Description BLOOD RIGHT HAND  Final   Special Requests IN PEDIATRIC BOTTLE 3CC  Final   Culture NO GROWTH 1 DAY  Final   Report Status PENDING   Incomplete  Respiratory virus panel     Status: None   Collection Time: 01/25/15  3:17 AM  Result Value Ref Range Status   Source - RVPAN NASAL SWAB  Corrected   Respiratory Syncytial Virus A Negative Negative Final   Respiratory Syncytial Virus B Negative Negative Final   Influenza A Negative Negative Final   Influenza B Negative Negative Final   Parainfluenza 1 Negative Negative Final   Parainfluenza 2 Negative Negative Final   Parainfluenza 3 Negative Negative Final   Metapneumovirus Negative Negative Final  Rhinovirus Negative Negative Final   Adenovirus Negative Negative Final    Comment: (NOTE) Performed At: [facility] [address] [place], [place] [id number] Mila HomerHancock William F MD WU:[id number]:[phone]     Radiology Reports Dg Chest 2 View  01/26/2015  CLINICAL DATA:  Community-acquired pneumonia EXAM: CHEST  2 VIEW COMPARISON:  01/24/2015 chest radiograph. FINDINGS: Slightly right rotated AP view. Stable cardiomediastinal silhouette with normal heart size. No pneumothorax. No pleural effusion. There is extensive patchy consolidation throughout the parahilar left lung, increased. No right lung opacity. IMPRESSION: Increased extensive patchy consolidation throughout the parahilar left lung, most in keeping with progressive multilobar pneumonia. Recommend follow-up PA and lateral post treatment chest radiographs in 6-8 weeks. Electronically Signed   By: Delbert PhenixJason A Poff M.D.   On: 01/26/2015 11:19   Dg Chest 2 View  01/24/2015  CLINICAL DATA:  Fever and productive cough. EXAM: CHEST  2 VIEW COMPARISON:  None. FINDINGS: There is an extensive consolidative pneumonia in the posterior superior aspect of the left lower lobe. Prominent bilateral peribronchial thickening. No effusions. Heart size and vascularity are normal. IMPRESSION: Left lower lobe pneumonia.  Bronchitic changes. Electronically Signed   By: Francene BoyersJames  Maxwell M.D.   On: 01/24/2015 15:29     CBC  Recent Labs Lab  01/24/15 1639 01/26/15 0745 01/27/15 0550  WBC 13.8* 17.5* 17.6*  HGB 13.5 12.4* 13.3  HCT 39.8 37.9* 39.3  PLT 193 184 212  MCV 83.3 84.2 82.9  MCH 28.2 27.6 28.1  MCHC 33.9 32.7 33.8  RDW 15.3 15.7* 15.3  LYMPHSABS 1.3  --   --   MONOABS 2.1*  --   --   EOSABS 0.0  --   --   BASOSABS 0.0  --   --     Chemistries   Recent Labs Lab 01/24/15 1639 01/26/15 0745 01/27/15 0550  NA 139 147* 148*  K 4.3 3.9 3.5  CL 103 113* 110  CO2 22 23 26   GLUCOSE 109* 96 107*  BUN 9 <5* <5*  CREATININE 1.26* 0.78 0.58*  CALCIUM 9.4 8.9 8.5*  AST 30 62* 89*  ALT 45 36 46  ALKPHOS 65 96 70  BILITOT 0.8 0.4 0.3   Coagulation profile  Recent Labs Lab 01/25/15 0207  INR 1.39    No results found for: HGBA1C Lab Results  Component Value Date   CREATININE 0.58* 01/27/2015     Scheduled Meds: . antiseptic oral rinse  7 mL Mouth Rinse q12n4p  . chlorhexidine  15 mL Mouth Rinse BID  . dextromethorphan-guaiFENesin  1 tablet Oral BID  . divalproex  250 mg Oral Q6H  . fluticasone  2 spray Each Nare Daily  . heparin  5,000 Units Subcutaneous 3 times per day  . piperacillin-tazobactam (ZOSYN)  IV  3.375 g Intravenous 3 times per day   Continuous Infusions: . sodium chloride 100 mL/hr at 01/26/15 1022    Principal Problem:   CAP (community acquired pneumonia) Active Problems:   Generalized convulsive epilepsy (HCC)   Congenital hemiparesis (HCC)   Developmental language disorder   Sepsis (HCC)   AKI (acute kidney injury) (HCC)   Nausea & vomiting     Nivan Melendrez  [facility] Pager [phone]   If 7PM-7AM, please contact night-coverage at [email or web address], password [email or web address] 01/27/2015, 10:22 AM  LOS: 2 days

## 2015-01-28 ENCOUNTER — Inpatient Hospital Stay (HOSPITAL_COMMUNITY): Payer: Medicaid Other

## 2015-01-28 LAB — CBC
HEMATOCRIT: 41.8 % (ref 39.0–52.0)
Hemoglobin: 13.4 g/dL (ref 13.0–17.0)
MCH: 27 pg (ref 26.0–34.0)
MCHC: 32.1 g/dL (ref 30.0–36.0)
MCV: 84.1 fL (ref 78.0–100.0)
PLATELETS: 231 10*3/uL (ref 150–400)
RBC: 4.97 MIL/uL (ref 4.22–5.81)
RDW: 15.4 % (ref 11.5–15.5)
WBC: 13.2 10*3/uL — AB (ref 4.0–10.5)

## 2015-01-28 LAB — COMPREHENSIVE METABOLIC PANEL
ALBUMIN: 3.1 g/dL — AB (ref 3.5–5.0)
ALT: 77 U/L — ABNORMAL HIGH (ref 17–63)
ANION GAP: 11 (ref 5–15)
AST: 100 U/L — AB (ref 15–41)
Alkaline Phosphatase: 77 U/L (ref 38–126)
BILIRUBIN TOTAL: 0.4 mg/dL (ref 0.3–1.2)
CHLORIDE: 108 mmol/L (ref 101–111)
CO2: 28 mmol/L (ref 22–32)
Calcium: 8.9 mg/dL (ref 8.9–10.3)
Creatinine, Ser: 0.78 mg/dL (ref 0.61–1.24)
GFR calc Af Amer: >60 mL/min (ref >60)
GFR calc non Af Amer: >60 mL/min (ref >60)
GLUCOSE: 139 mg/dL — AB (ref 65–99)
POTASSIUM: 3.1 mmol/L — AB (ref 3.5–5.1)
SODIUM: 147 mmol/L — AB (ref 135–145)
TOTAL PROTEIN: 7.1 g/dL (ref 6.5–8.1)

## 2015-01-28 MED ORDER — POTASSIUM CHLORIDE CRYS ER 20 MEQ PO TBCR
40.0000 meq | EXTENDED_RELEASE_TABLET | Freq: Once | ORAL | Status: AC
  Administered 2015-01-28: 40 meq via ORAL
  Filled 2015-01-28 (×2): qty 2

## 2015-01-28 MED ORDER — MAGNESIUM SULFATE 2 GM/50ML IV SOLN
2.0000 g | Freq: Once | INTRAVENOUS | Status: AC
  Administered 2015-01-28: 2 g via INTRAVENOUS
  Filled 2015-01-28: qty 50

## 2015-01-28 MED ORDER — KCL IN DEXTROSE-NACL 40-5-0.9 MEQ/L-%-% IV SOLN
INTRAVENOUS | Status: DC
  Administered 2015-01-28 – 2015-01-29 (×2): via INTRAVENOUS
  Filled 2015-01-28 (×6): qty 1000

## 2015-01-28 MED ORDER — CEFUROXIME AXETIL 500 MG PO TABS
500.0000 mg | ORAL_TABLET | Freq: Two times a day (BID) | ORAL | Status: DC
  Administered 2015-01-28 – 2015-01-29 (×2): 500 mg via ORAL
  Filled 2015-01-28 (×4): qty 1

## 2015-01-28 NOTE — Progress Notes (Signed)
Physical Therapy Treatment/DC Note Patient Details Name: Carl Bryant MRN: [id number] DOB: November 26, 1990 Today's Date: 2015-02-16    History of Present Illness 25 y.o. male with PMH of cerebral palsy with mild right-sided weakness, mental retardation, seizure, who presents with fever, cough, shortness of breath and admitted with CAP.    PT Comments    Pt doing well with mobility. Able to amb on RA with adequate SpO2. Expect pt will continue to progress with endurance and return to his baseline. No further PT needed.  Follow Up Recommendations  No PT follow up     Equipment Recommendations  None recommended by PT    Recommendations for Other Services       Precautions / Restrictions Precautions Precautions: None Restrictions Weight Bearing Restrictions: No    Mobility  Bed Mobility Overal bed mobility: Modified Independent Bed Mobility: Sit to Supine       Sit to supine: Modified independent (Device/Increase time)      Transfers Overall transfer level: Modified independent Equipment used: None Transfers: Sit to/from Stand Sit to Stand: Modified independent (Device/Increase time)            Ambulation/Gait Ambulation/Gait assistance: Supervision Ambulation Distance (Feet): 225 Feet Assistive device: None Gait Pattern/deviations: Wide base of support Gait velocity: decreased Gait velocity interpretation: Below normal speed for age/gender General Gait Details: Amb on RA with SpO2 90-92% during amb   Stairs            Wheelchair Mobility    Modified Rankin (Stroke Patients Only)       Balance     Sitting balance-Leahy Scale: Good       Standing balance-Leahy Scale: Good                      Cognition Arousal/Alertness: Awake/alert Behavior During Therapy: WFL for tasks assessed/performed Overall Cognitive Status: History of cognitive impairments - at baseline                      Exercises      General Comments         Pertinent Vitals/Pain Pain Assessment: No/denies pain    Home Living                      Prior Function            PT Goals (current goals can now be found in the care plan section) Progress towards PT goals: Goals met/education completed, patient discharged from PT    Frequency  Min 3X/week    PT Plan Current plan remains appropriate;Other (comment) (Pt dc'd from PT)    Co-evaluation             End of Session   Activity Tolerance: Patient tolerated treatment well (elevated HR and decreased o2 on RA) Patient left: in bed;with call bell/phone within reach;with family/visitor present     Time: 3382-5053 PT Time Calculation (min) (ACUTE ONLY): 18 min  Charges:  $Gait Training: 8-22 mins                    G Codes:      Teighan Aubert Feb 16, 2015, 12:16 PM [employer] PT [phone]

## 2015-01-28 NOTE — Progress Notes (Signed)
SATURATION QUALIFICATIONS: (This note is used to comply with regulatory documentation for home oxygen)  Patient Saturations on Room Air at Rest = 94%  Patient Saturations on Room Air while Ambulating = 90-92%  Currently pt not requiring supplemental O2 to maintain SpO2 with mobility.  [employer] Corporation[employer]ary Harding [employer] PT [phone]

## 2015-01-28 NOTE — Progress Notes (Signed)
[facility] PROGRESS NOTE  Carl Bryant WUJ:[id number] DOB: Feb 04, 1990 DOA: 01/24/2015 PCP: Minda Meo, MD  Length of stay: 3   Assessment/Plan: Principal Problem:   CAP (community acquired pneumonia) Active Problems:   Generalized convulsive epilepsy (HCC)   Congenital hemiparesis (HCC)   Developmental language disorder   Sepsis (HCC)   AKI (acute kidney injury) (HCC)   Nausea & vomiting   Brief HPI Carl Bryant is a 25 y.o. male with PMH of cerebral palsy with mild right-sided weakness, mental retardation, seizure, who presented with fever, cough, shortness of breath. Patient found to have left lower lobe pneumonia, now bacteremic with gram-negative rods.  Assessment and plan Sepsis 2/2 to CAP Patient has pneumonia as shown by chest x-ray. Gram-negative rods on blood culture. Patient appears to be better this morning. Await final identification and sensitivities. Patient was initially started on ceftriaxone and azithromycin. Changed to Zosyn. Urine legionella and S. pneumococcal antigen were both negative. Influenza PCR is negative as well. Respiratory viral panel was negative. HIV is negative. Repeat chest x-ray due to worsening white count , shows worsening patchy consolidation with progressive multilobar pneumonia, radiologist recommends repeat chest x-ray in 6-8 weeks. The PVCs stable compared to yesterday. Blood culture positive for Haemophilus influenza: Patient started on Ceftin, DC Zosyn   Nausea and vomiting Nausea and vomiting resolved Thought to be secondary to the above. Abdomen is benign. Lipase is normal. Abdominal imaging is benign .  Hypernatremia with hypokalemia IV fluids was changed over to half normal saline with potassium Recheck electrolytes tomorrow   Generalized convulsive epilepsy Patient to be continued on Depakote  AKI Likely due to prerenal secondary to dehydration NSAIDs - IVF as above Renal function has  improved.    DVT prophylaxsis heparin  Code Status:      Code Status Orders        Start     Ordered   01/25/15 0024  Full code   Continuous     01/25/15 0024    Advance Directive Documentation        Most Recent Value   Type of Advance Directive  Healthcare Power of Attorney   Pre-existing out of facility DNR order (yellow form or pink MOST form)     "MOST" Form in Place?        Family Communication: Discussed with the patient and his mother  Disposition Plan:  Anticipate discharge tomorrow   Consultants:  None  Procedures:  None  Antibiotics: Anti-infectives    Start     Dose/Rate Route Frequency Ordered Stop   01/28/15 1700  cefUROXime (CEFTIN) tablet 500 mg     500 mg Oral 2 times daily with meals 01/28/15 1231     01/26/15 1415  piperacillin-tazobactam (ZOSYN) IVPB 3.375 g  Status:  Discontinued     3.375 g 12.5 mL/hr over 240 Minutes Intravenous 3 times per day 01/26/15 1414 01/28/15 1231   01/25/15 2200  azithromycin (ZITHROMAX) 500 mg in dextrose 5 % 250 mL IVPB  Status:  Discontinued     500 mg 250 mL/hr over 60 Minutes Intravenous Every 24 hours 01/24/15 2128 01/26/15 1410   01/25/15 2100  cefTRIAXone (ROCEPHIN) 1 g in dextrose 5 % 50 mL IVPB  Status:  Discontinued     1 g 100 mL/hr over 30 Minutes Intravenous Every 24 hours 01/24/15 2128 01/26/15 1332   01/25/15 0030  azithromycin (ZITHROMAX) 500 mg in dextrose 5 % 250 mL IVPB  Status:  Discontinued     500 mg 250 mL/hr over 60 Minutes Intravenous Every 24 hours 01/25/15 0023 01/25/15 0108   01/24/15 2115  cefTRIAXone (ROCEPHIN) 1 g in dextrose 5 % 50 mL IVPB     1 g 100 mL/hr over 30 Minutes Intravenous  Once 01/24/15 2111 01/24/15 2235   01/24/15 2115  azithromycin (ZITHROMAX) 500 mg in dextrose 5 % 250 mL IVPB     500 mg 250 mL/hr over 60 Minutes Intravenous  Once 01/24/15 2111 01/24/15 2346         Subjective: Continues to improve, shortness of breath or chest pain today abdominal pain  is improving, mother is by bedside  Objective: Filed Vitals:   01/27/15 0525 01/27/15 1445 01/27/15 2132 01/28/15 0518  BP: 141/89 126/78 139/81 135/81  Pulse: 73 80 88 90  Temp:   98.3 F (36.8 C) 98.3 F (36.8 C)  TempSrc:   Oral Oral  Resp:  18 18 18   Height:      Weight:      SpO2:  92% 95% 91%    Intake/Output Summary (Last 24 hours) at 01/28/15 1240 Last data filed at 01/28/15 1118  Gross per 24 hour  Intake   2850 ml  Output   3012 ml  Net   -162 ml    Exam:  General: No acute distress Lungs: Ankles, present bilateral bases. No wheezing. Cardiovascular: Regular rate and rhythm without murmur gallop or rub normal S1 and S2 Abdomen: Abdomen is soft. Mild, vague tenderness present in the epigastric and right upper quadrant areas. No rebound, rigidity or guarding. Extremities: No significant cyanosis, clubbing, or edema bilateral lower extremities   Data Review   Micro Results Recent Results (from the past 240 hour(s))  Urine culture     Status: None   Collection Time: 01/24/15  4:34 PM  Result Value Ref Range Status   Specimen Description URINE, CLEAN CATCH  Final   Special Requests NONE  Final   Culture MULTIPLE SPECIES PRESENT, SUGGEST RECOLLECTION  Final   Report Status 01/26/2015 FINAL  Final  Culture, blood (routine x 2)     Status: None (Preliminary result)   Collection Time: 01/24/15  4:41 PM  Result Value Ref Range Status   Specimen Description BLOOD LEFT ANTECUBITAL  Final   Special Requests BOTTLES DRAWN AEROBIC AND ANAEROBIC 10CC  Final   Culture  Setup Time   Final    GRAM NEGATIVE RODS AEROBIC BOTTLE ONLY CRITICAL RESULT CALLED TO, READ BACK BY AND VERIFIED WITH: N OSEI @0200  01/26/15 MKELLY    Culture   Final    HAEMOPHILUS INFLUENZAE BETA LACTAMASE NEGATIVE Referred to [facility] [place] Ser[place]. HEALTH DEPARTMENT NOTIFIED    Report Status PENDING  Incomplete  Culture, blood (routine x 2)      Status: None (Preliminary result)   Collection Time: 01/25/15  2:00 AM  Result Value Ref Range Status   Specimen Description BLOOD RIGHT HAND  Final   Special Requests IN PEDIATRIC BOTTLE 3CC  Final   Culture NO GROWTH 2 DAYS  Final   Report Status PENDING  Incomplete  Respiratory virus panel     Status: None   Collection Time: 01/25/15  3:17 AM  Result Value Ref Range Status   Source - RVPAN NASAL SWAB  Corrected   Respiratory Syncytial Virus A Negative Negative Final   Respiratory Syncytial Virus B Negative Negative Final   Influenza A Negative Negative  Final   Influenza B Negative Negative Final   Parainfluenza 1 Negative Negative Final   Parainfluenza 2 Negative Negative Final   Parainfluenza 3 Negative Negative Final   Metapneumovirus Negative Negative Final   Rhinovirus Negative Negative Final   Adenovirus Negative Negative Final    Comment: (NOTE) Performed At: [facility] [address] [place], [place] [id number] Mila Homer MD WU:[id number]     Radiology Reports Dg Chest 2 View  01/26/2015  CLINICAL DATA:  Community-acquired pneumonia EXAM: CHEST  2 VIEW COMPARISON:  01/24/2015 chest radiograph. FINDINGS: Slightly right rotated AP view. Stable cardiomediastinal silhouette with normal heart size. No pneumothorax. No pleural effusion. There is extensive patchy consolidation throughout the parahilar left lung, increased. No right lung opacity. IMPRESSION: Increased extensive patchy consolidation throughout the parahilar left lung, most in keeping with progressive multilobar pneumonia. Recommend follow-up PA and lateral post treatment chest radiographs in 6-8 weeks. Electronically Signed   By: Delbert Phenix M.D.   On: 01/26/2015 11:19   Dg Chest 2 View  01/24/2015  CLINICAL DATA:  Fever and productive cough. EXAM: CHEST  2 VIEW COMPARISON:  None. FINDINGS: There is an extensive consolidative pneumonia in the posterior superior aspect of the left lower lobe.  Prominent bilateral peribronchial thickening. No effusions. Heart size and vascularity are normal. IMPRESSION: Left lower lobe pneumonia.  Bronchitic changes. Electronically Signed   By: Francene Boyers M.D.   On: 01/24/2015 15:29   US Abdomen Complete  01/28/2015  CLINICAL DATA:  Nausea and vomiting for 3 days, abnormal LFTs, cerebral palsy EXAM: ABDOMEN ULTRASOUND COMPLETE COMPARISON:  None. FINDINGS: Gallbladder: No gallstones or wall thickening visualized. No sonographic Murphy sign noted by sonographer. Common bile duct: Diameter: 3.8 mm in diameter within normal limits. Liver: Mild heterogeneous increased echogenicity of the liver suspicious for fatty infiltration. No focal hepatic mass. IVC: Not well seen due to bowel gas Pancreas: Not well seen due to bowel gas Spleen: Size and appearance within normal limits. Measures 7.8 cm in length Right Kidney: Length: 13 cm. Echogenicity within normal limits. No mass or hydronephrosis visualized. Left Kidney: Length: 13.2 cm. Echogenicity within normal limits. No mass or hydronephrosis visualized. Abdominal aorta: No aneurysm visualized. Measures up to 1.9 cm in diameter. Other findings: None. IMPRESSION: 1. No gallstones are noted within gallbladder. Normal CBD. Suboptimal exam due to abundant bowel gas. 2. Normal CBD. 3. Mild heterogeneous increased echogenicity of the liver suspicious for fatty infiltration. 4. No hydronephrosis. 5. No aortic aneurysm. Electronically Signed   By: Natasha Mead M.D.   On: 01/28/2015 08:06   Dg Abd 2 Views  01/27/2015  CLINICAL DATA:  Nausea and vomiting for 3 days EXAM: ABDOMEN - 2 VIEW COMPARISON:  None. FINDINGS: There is normal small bowel gas pattern. Mild colonic gas noted in transverse colon splenic flexure of the colon proximal left colon. Question hepatomegaly. No free abdominal air. IMPRESSION: Normal small bowel gas pattern. Mild colonic gas in transverse colon and proximal left colon. No free abdominal air. Question  hepatomegaly. Electronically Signed   By: Natasha Mead M.D.   On: 01/27/2015 13:08     CBC  Recent Labs Lab 01/24/15 1639 01/26/15 0745 01/27/15 0550 01/28/15 1040  WBC 13.8* 17.5* 17.6* 13.2*  HGB 13.5 12.4* 13.3 13.4  HCT 39.8 37.9* 39.3 41.8  PLT 193 184 212 231  MCV 83.3 84.2 82.9 84.1  MCH 28.2 27.6 28.1 27.0  MCHC 33.9 32.7 33.8 32.1  RDW 15.3 15.7* 15.3 15.4  LYMPHSABS 1.3  --   --   --   MONOABS 2.1*  --   --   --   EOSABS 0.0  --   --   --   BASOSABS 0.0  --   --   --     Chemistries   Recent Labs Lab 01/24/15 1639 01/26/15 0745 01/27/15 0550 01/28/15 1040  NA 139 147* 148* 147*  K 4.3 3.9 3.5 3.1*  CL 103 113* 110 108  CO2 22 23 26 28   GLUCOSE 109* 96 107* 139*  BUN 9 <5* <5* <5*  CREATININE 1.26* 0.78 0.58* 0.78  CALCIUM 9.4 8.9 8.5* 8.9  AST 30 62* 89* 100*  ALT 45 36 46 77*  ALKPHOS 65 96 70 77  BILITOT 0.8 0.4 0.3 0.4   Coagulation profile  Recent Labs Lab 01/25/15 0207  INR 1.39    No results found for: HGBA1C Lab Results  Component Value Date   CREATININE 0.78 01/28/2015     Scheduled Meds: . antiseptic oral rinse  7 mL Mouth Rinse q12n4p  . cefUROXime  500 mg Oral BID WC  . chlorhexidine  15 mL Mouth Rinse BID  . dextromethorphan-guaiFENesin  1 tablet Oral BID  . divalproex  250 mg Oral Q6H  . fluticasone  2 spray Each Nare Daily  . heparin  5,000 Units Subcutaneous 3 times per day   Continuous Infusions: . dextrose 5 % and 0.9 % NaCl with KCl 40 mEq/L      Principal Problem:   CAP (community acquired pneumonia) Active Problems:   Generalized convulsive epilepsy (HCC)   Congenital hemiparesis (HCC)   Developmental language disorder   Sepsis (HCC)   AKI (acute kidney injury) (HCC)   Nausea & vomiting     Alissandra Geoffroy  [facility] Pager [phone]  If 7PM-7AM, please contact night-coverage at [email or web address], password [email or web address] 01/28/2015, 12:40 PM  LOS: 3 days

## 2015-01-29 LAB — CBC
HCT: 46.3 % (ref 39.0–52.0)
Hemoglobin: 15.4 g/dL (ref 13.0–17.0)
MCH: 28.1 pg (ref 26.0–34.0)
MCHC: 33.3 g/dL (ref 30.0–36.0)
MCV: 84.3 fL (ref 78.0–100.0)
PLATELETS: 237 10*3/uL (ref 150–400)
RBC: 5.49 MIL/uL (ref 4.22–5.81)
RDW: 15.4 % (ref 11.5–15.5)
WBC: 17.9 10*3/uL — AB (ref 4.0–10.5)

## 2015-01-29 LAB — BASIC METABOLIC PANEL
ANION GAP: 11 (ref 5–15)
BUN: <5 mg/dL — ABNORMAL LOW (ref 6–20)
CALCIUM: 8.9 mg/dL (ref 8.9–10.3)
CO2: 23 mmol/L (ref 22–32)
CREATININE: 0.66 mg/dL (ref 0.61–1.24)
Chloride: 112 mmol/L — ABNORMAL HIGH (ref 101–111)
GLUCOSE: 105 mg/dL — AB (ref 65–99)
Potassium: 4.4 mmol/L (ref 3.5–5.1)
Sodium: 146 mmol/L — ABNORMAL HIGH (ref 135–145)

## 2015-01-29 MED ORDER — DM-GUAIFENESIN ER 30-600 MG PO TB12: 1.0000 | ORAL_TABLET | Freq: Two times a day (BID) | ORAL | Status: DC

## 2015-01-29 MED ORDER — CEFUROXIME AXETIL 500 MG PO TABS: 500.0000 mg | ORAL_TABLET | Freq: Two times a day (BID) | ORAL | Status: DC

## 2015-01-29 MED ORDER — FLUTICASONE PROPIONATE 50 MCG/ACT NA SUSP: 2.0000 | Freq: Every day | NASAL | Status: DC

## 2015-01-29 NOTE — Care Management Note (Signed)
Case Management Note  Patient Details  Name: Carl Bryant MRN: [id number] Date of Birth: 1990-06-26  Subjective/Objective:  Patient to be dc to home , no needs.                  Action/Plan:   Expected Discharge Date:                  Expected Discharge Plan:  Home/Self Care  In-House Referral:     Discharge planning Services  CM Consult  Post Acute Care Choice:    Choice offered to:     DME Arranged:    DME Agency:     HH Arranged:    HH Agency:     Status of Service:  Completed, signed off  Medicare Important Message Given:    Date Medicare IM Given:    Medicare IM give by:    Date Additional Medicare IM Given:    Additional Medicare Important Message give by:     If discussed at Long Length of Stay Meetings, dates discussed:    Additional Comments:  Leone Havenaylor, Raeden Schippers [place], RN 01/29/2015, 2:23 PM

## 2015-01-29 NOTE — Discharge Summary (Signed)
Physician Discharge Summary  Carl Bryant MRN: [id number] DOB/AGE: 1990-08-01 25 y.o.  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     cefUROXime 500 MG tablet  Commonly known as:  CEFTIN  Take 1 tablet (500 mg total) by mouth 2 (two) times daily with a meal.     DEPAKOTE 250 MG DR tablet  Generic drug:  divalproex  Take 1 tab by mouth 4 times daily.     dextromethorphan-guaiFENesin 30-600 MG 12hr tablet  Commonly known as:  MUCINEX DM  Take 1 tablet by mouth 2 (two) times daily.     fluticasone 50 MCG/ACT nasal spray  Commonly known as:  FLONASE  Place 2 sprays into both nostrils daily.         Discharge Condition: Stable   Discharge Instructions       Discharge Instructions    Diet - low sodium heart healthy    Complete by:  As directed      Increase activity slowly    Complete by:  As directed            No Known Allergies    Disposition: 01-Home or Self Care   Consults:  None     Significant Diagnostic Studies:  Dg Chest 2 View  01/26/2015  CLINICAL DATA:  Community-acquired pneumonia EXAM: CHEST  2 VIEW COMPARISON:  01/24/2015 chest radiograph. FINDINGS: Slightly right rotated AP view.  Stable cardiomediastinal silhouette with normal heart size. No pneumothorax. No pleural effusion. There is extensive patchy consolidation throughout the parahilar left lung, increased. No right lung opacity. IMPRESSION: Increased extensive patchy consolidation throughout the parahilar left lung, most in keeping with progressive multilobar pneumonia. Recommend follow-up PA and lateral post treatment chest radiographs in 6-8 weeks. Electronically Signed   By: Ilona Sorrel M.D.   On: 01/26/2015 11:19   Dg Chest 2 View  01/24/2015  CLINICAL DATA:  Fever and productive cough. EXAM: CHEST  2 VIEW COMPARISON:  None. FINDINGS: There is an extensive consolidative pneumonia in the posterior superior aspect of the left lower lobe. Prominent bilateral peribronchial thickening. No effusions. Heart size and vascularity are normal. IMPRESSION: Left lower lobe pneumonia.  Bronchitic changes. Electronically Signed   By: Lorriane Shire M.D.   On: 01/24/2015 15:29   US Abdomen Complete  01/28/2015  CLINICAL DATA:  Nausea and vomiting for 3 days, abnormal LFTs, cerebral palsy EXAM: ABDOMEN ULTRASOUND COMPLETE COMPARISON:  None. FINDINGS: Gallbladder: No gallstones or wall thickening visualized. No sonographic Murphy sign noted by sonographer. Common bile duct: Diameter: 3.8 mm in diameter within normal limits. Liver: Mild heterogeneous increased echogenicity of the liver  suspicious for fatty infiltration. No focal hepatic mass. IVC: Not well seen due to bowel gas Pancreas: Not well seen due to bowel gas Spleen: Size and appearance within normal limits. Measures 7.8 cm in length Right Kidney: Length: 13 cm. Echogenicity within normal limits. No mass or hydronephrosis visualized. Left Kidney: Length: 13.2 cm. Echogenicity within normal limits. No mass or hydronephrosis visualized. Abdominal aorta: No aneurysm visualized. Measures up to 1.9 cm in diameter. Other findings: None. IMPRESSION: 1. No gallstones are noted within  gallbladder. Normal CBD. Suboptimal exam due to abundant bowel gas. 2. Normal CBD. 3. Mild heterogeneous increased echogenicity of the liver suspicious for fatty infiltration. 4. No hydronephrosis. 5. No aortic aneurysm. Electronically Signed   By: Lahoma Crocker M.D.   On: 01/28/2015 08:06   Dg Abd 2 Views  01/27/2015  CLINICAL DATA:  Nausea and vomiting for 3 days EXAM: ABDOMEN - 2 VIEW COMPARISON:  None. FINDINGS: There is normal small bowel gas pattern. Mild colonic gas noted in transverse colon splenic flexure of the colon proximal left colon. Question hepatomegaly. No free abdominal air. IMPRESSION: Normal small bowel gas pattern. Mild colonic gas in transverse colon and proximal left colon. No free abdominal air. Question hepatomegaly. Electronically Signed   By: Lahoma Crocker M.D.   On: 01/27/2015 13:08       Filed Weights   01/24/15 1617 01/25/15 0148  Weight: 89.926 kg (198 lb 4 oz) 92.4 kg (203 lb 11.3 oz)     Microbiology: Recent Results (from the past 240 hour(s))  Urine culture     Status: None   Collection Time: 01/24/15  4:34 PM  Result Value Ref Range Status   Specimen Description URINE, CLEAN CATCH  Final   Special Requests NONE  Final   Culture MULTIPLE SPECIES PRESENT, SUGGEST RECOLLECTION  Final   Report Status 01/26/2015 FINAL  Final  Culture, blood (routine x 2)     Status: None (Preliminary result)   Collection Time: 01/24/15  4:41 PM  Result Value Ref Range Status   Specimen Description BLOOD LEFT ANTECUBITAL  Final   Special Requests BOTTLES DRAWN AEROBIC AND ANAEROBIC 10CC  Final   Culture  Setup Time   Final    GRAM NEGATIVE RODS AEROBIC BOTTLE ONLY CRITICAL RESULT CALLED TO, READ BACK BY AND VERIFIED WITH: N OSEI '@0200'$  01/26/15 MKELLY    Culture   Final    HAEMOPHILUS INFLUENZAE BETA LACTAMASE NEGATIVE Referred to [facility] in [place], [place] for Serotyping. HEALTH DEPARTMENT NOTIFIED    Report Status PENDING  Incomplete   Culture, blood (routine x 2)     Status: None (Preliminary result)   Collection Time: 01/25/15  2:00 AM  Result Value Ref Range Status   Specimen Description BLOOD RIGHT HAND  Final   Special Requests IN PEDIATRIC BOTTLE 3CC  Final   Culture NO GROWTH 4 DAYS  Final   Report Status PENDING  Incomplete  Respiratory virus panel     Status: None   Collection Time: 01/25/15  3:17 AM  Result Value Ref Range Status   Source - RVPAN NASAL SWAB  Corrected   Respiratory Syncytial Virus A Negative Negative Final   Respiratory Syncytial Virus B Negative Negative Final   Influenza A Negative Negative Final   Influenza B Negative Negative Final   Parainfluenza 1 Negative Negative Final   Parainfluenza 2 Negative Negative Final   Parainfluenza 3 Negative Negative Final   Metapneumovirus Negative Negative Final   Rhinovirus  Negative Negative Final   Adenovirus Negative Negative Final    Comment: (NOTE) Performed At: [facility] [place], [place] [id number] Lindon Romp MD TK:[id number]        Blood Culture    Component Value Date/Time   SDES URINE, RANDOM 01/25/2015 0317   SPECREQUEST NONE 01/25/2015 0317   CULT NO GROWTH 4 DAYS 01/25/2015 0200   REPTSTATUS 01/26/2015 FINAL 01/25/2015 0317      Labs: Results for orders placed or performed during the hospital encounter of 01/24/15 (from the past 48 hour(s))  CBC     Status: Abnormal   Collection Time: 01/28/15 10:40 AM  Result Value Ref Range   WBC 13.2 (H) 4.0 - 10.5 K/uL   RBC 4.97 4.22 - 5.81 MIL/uL   Hemoglobin 13.4 13.0 - 17.0 g/dL   HCT 41.8 39.0 - 52.0 %   MCV 84.1 78.0 - 100.0 fL   MCH 27.0 26.0 - 34.0 pg   MCHC 32.1 30.0 - 36.0 g/dL   RDW 15.4 11.5 - 15.5 %   Platelets 231 150 - 400 K/uL  Comprehensive metabolic panel     Status: Abnormal   Collection Time: 01/28/15 10:40 AM  Result Value Ref Range   Sodium 147 (H) 135 - 145 mmol/L   Potassium 3.1 (L) 3.5 - 5.1 mmol/L   Chloride 108 101  - 111 mmol/L   CO2 28 22 - 32 mmol/L   Glucose, Bld 139 (H) 65 - 99 mg/dL   BUN <5 (L) 6 - 20 mg/dL   Creatinine, Ser 0.78 0.61 - 1.24 mg/dL   Calcium 8.9 8.9 - 10.3 mg/dL   Total Protein 7.1 6.5 - 8.1 g/dL   Albumin 3.1 (L) 3.5 - 5.0 g/dL   AST 100 (H) 15 - 41 U/L   ALT 77 (H) 17 - 63 U/L   Alkaline Phosphatase 77 38 - 126 U/L   Total Bilirubin 0.4 0.3 - 1.2 mg/dL   GFR calc non Af Amer >60 >60 mL/min   GFR calc Af Amer >60 >60 mL/min    Comment: (NOTE) The eGFR has been calculated using the CKD EPI equation. This calculation has not been validated in all clinical situations. eGFR's persistently <60 mL/min signify possible Chronic Kidney Disease.    Anion gap 11 5 - 15  CBC     Status: Abnormal   Collection Time: 01/29/15  7:15 AM  Result Value Ref Range   WBC 17.9 (H) 4.0 - 10.5 K/uL   RBC 5.49 4.22 - 5.81 MIL/uL   Hemoglobin 15.4 13.0 - 17.0 g/dL   HCT 46.3 39.0 - 52.0 %   MCV 84.3 78.0 - 100.0 fL   MCH 28.1 26.0 - 34.0 pg   MCHC 33.3 30.0 - 36.0 g/dL   RDW 15.4 11.5 - 15.5 %   Platelets 237 150 - 400 K/uL  Basic metabolic panel     Status: Abnormal   Collection Time: 01/29/15  7:15 AM  Result Value Ref Range   Sodium 146 (H) 135 - 145 mmol/L   Potassium 4.4 3.5 - 5.1 mmol/L   Chloride 112 (H) 101 - 111 mmol/L   CO2 23 22 - 32 mmol/L   Glucose, Bld 105 (H) 65 - 99 mg/dL   BUN <5 (L) 6 - 20 mg/dL   Creatinine, Ser 0.66 0.61 - 1.24 mg/dL   Calcium 8.9 8.9 - 10.3 mg/dL   GFR calc non Af Amer >60 >60 mL/min   GFR calc  Af Amer >60 >60 mL/min    Comment: (NOTE) The eGFR has been calculated using the CKD EPI equation. This calculation has not been validated in all clinical situations. eGFR's persistently <60 mL/min signify possible Chronic Kidney Disease.    Anion gap 11 5 - 15     Lipid Panel  No results found for: CHOL, TRIG, HDL, CHOLHDL, VLDL, LDLCALC, LDLDIRECT   No results found for: HGBA1C   Lab Results  Component Value Date   CREATININE 0.66  01/29/2015     Brief HPI Carl Bryant is a 25 y.o. male with PMH of cerebral palsy with mild right-sided weakness, mental retardation, seizure, who presented with fever, cough, shortness of breath. Patient found to have left lower lobe pneumonia, now bacteremic with gram-negative rods. HAEMOPHILUS INFLUENZAE bacteremia  Assessment and plan Sepsis secondary to CAP Patient has pneumonia as shown by chest x-ray. Gram-negative rods on blood culture, Haemophilus influenza bacteremia. Clinically improving last 4 days. Patient was initially started on ceftriaxone and azithromycin. Changed to Zosyn due to persistently elevated white count. Urine legionella and S. pneumococcal antigen were both negative. Influenza PCR is negative as well. Respiratory viral panel was negative. HIV is negative. Repeat chest x-ray due to worsening white count , shows worsening patchy consolidation with progressive multilobar pneumonia, radiologist recommends repeat chest x-ray in 6-8 weeks. Subsequently switched to Ceftin 500 mg bid, continue for another 5 days.     Nausea and vomiting Nausea and vomiting resolved Thought to be secondary to the above. Abdomen is benign. Lipase is normal. Abdominal imaging is benign .  Hypernatremia with hypokalemia IV fluids was changed over to half normal saline with potassium Repeat electrolytes in the outpatient setting   Generalized convulsive epilepsy Patient to be continued on Depakote  AKI Likely due to prerenal secondary to dehydration NSAIDs - IVF as above Renal function has improved. Avoid NSAIDs     Discharge Exam:    Blood pressure 141/87, pulse 67, temperature 98.7 F (37.1 C), temperature source Oral, resp. rate 20, height '5\' 7"'$  (1.702 m), weight 92.4 kg (203 lb 11.3 oz), SpO2 96 %.  General: No acute distress Lungs: Ankles, present bilateral bases. No wheezing. Cardiovascular: Regular rate and rhythm without murmur gallop or rub normal S1 and  S2 Abdomen: Abdomen is soft. Mild, vague tenderness present in the epigastric and right upper quadrant areas. No rebound, rigidity or guarding. Extremities: No significant cyanosis, clubbing, or edema bilateral lower extremities    Follow-up Information    Follow up with ARONSON,RICHARD A, MD. Schedule an appointment as soon as possible for a visit in 1 week.   Specialty:  Internal Medicine   Contact information:   [place] [address] [phone]       Signed: Reyne Dumas 01/29/2015, 1:04 PM        Time spent >45 mins

## 2015-01-29 NOTE — Progress Notes (Signed)
Carl Bryant to be D/C'd Home per MD order. Discussed with the patient and all questions fully answered.  VSS, Skin clean, dry and intact without evidence of skin break down, no evidence of skin tears noted. IV catheter discontinued intact. Site without signs and symptoms of complications. Dressing and pressure applied.  An After Visit Summary was printed and given to the patient. Prescriptions sent to pt. Primary pharmacy: [facility]  D/c education completed with patient/family including follow up instructions, medication list, d/c activities limitations if indicated, with other d/c instructions as indicated by MD - patient able to verbalize understanding, all questions fully answered.   Patient instructed to return to ED, call 911, or call MD for any changes in condition.   Patient escorted via W/C with nurse tech, and D/C home via private auto with parents.

## 2015-01-30 LAB — CULTURE, BLOOD (ROUTINE X 2): Culture: NO GROWTH

## 2015-02-17 ENCOUNTER — Encounter (HOSPITAL_COMMUNITY): Payer: Self-pay

## 2015-02-17 LAB — CULTURE, BLOOD (ROUTINE X 2)

## 2015-06-14 ENCOUNTER — Ambulatory Visit (INDEPENDENT_AMBULATORY_CARE_PROVIDER_SITE_OTHER): Payer: Medicaid Other | Admitting: Family

## 2015-06-14 ENCOUNTER — Encounter: Payer: Self-pay | Admitting: Family

## 2015-06-14 VITALS — BP 126/80 | HR 82 | Ht 66.75 in | Wt 196.2 lb

## 2015-06-14 DIAGNOSIS — F809 Developmental disorder of speech and language, unspecified: Secondary | ICD-10-CM | POA: Diagnosis not present

## 2015-06-14 DIAGNOSIS — G808 Other cerebral palsy: Secondary | ICD-10-CM | POA: Diagnosis not present

## 2015-06-14 DIAGNOSIS — G40309 Generalized idiopathic epilepsy and epileptic syndromes, not intractable, without status epilepticus: Secondary | ICD-10-CM

## 2015-06-14 DIAGNOSIS — G40209 Localization-related (focal) (partial) symptomatic epilepsy and epileptic syndromes with complex partial seizures, not intractable, without status epilepticus: Secondary | ICD-10-CM

## 2015-06-14 DIAGNOSIS — M2450 Contracture, unspecified joint: Secondary | ICD-10-CM

## 2015-06-14 DIAGNOSIS — G830 Diplegia of upper limbs: Secondary | ICD-10-CM

## 2015-06-14 NOTE — Progress Notes (Signed)
Patient: Carl Bryant MRN: [id number] Sex: male DOB: December 11, 1990  Provider: Elveria Rising, NP Location of Care: [facility]  Note type: Routine return visit  History of Present Illness: Referral Source: Dr. Maryellen Bryant History from: father, patient and [facility] chart Chief Complaint: Cerebral Palsy/Seizures  Carl Bryant is a 25 y.o. 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 He also has attention-deficit disorder, mixed type but no longer takes medication for this since he is out of school. He has had no seizures since 01/17/1999. Carl Bryant was last seen Jun 11, 2014.  Today Carl Bryant's reports that Carl Bryant was doing well until January 1st when he was admitted to the hospital for treatment of pneumonia. Dad said that the family went on cruise to the [place] for Christmas, and Carl Bryant began having a fever and cough on the drive home from the cruise terminal in [place]. Fortunately, he recovered well and has been doing well since then.   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 Carl Bryant bowls regularly with his father. 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   Carl Bryant continues to follow up with Dr. 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 He may need to have a plate removed from his wrist as well as surgery on the 3rd finger of this right hand that looks to be developing a trigger finger type deformity.   Carl Bryant has been otherwise healthy since last seen. Neither Carl Bryant nor his father have other health concerns about him today.  Review of Systems: Please see the HPI for  neurologic and other pertinent review of systems. Otherwise, the following systems are noncontributory including constitutional, eyes, ears, nose and throat, cardiovascular, respiratory, gastrointestinal, genitourinary, musculoskeletal, skin, endocrine, hematologic/lymph, allergic/immunologic and psychiatric.   Past Medical History  Diagnosis Date  . Seizures ([facility])   . Congenital hemiparesis ([facility])   . Developmental language disorder   . Cerebral palsy ([facility])    Hospitalizations: Yes.  , Head Injury: No., Nervous System Infections: No., Immunizations up to date: Yes.   Past Medical History Comments: right hemiparesis, diplegic gait, complex partial seizures with secondary generalization, developmental language disorder, dyscalculia  Surgical History Past Surgical History  Procedure Laterality Date  . Tendon release  09/2011    right wrist, elbow  . Foot capsule release w/ percutaneous heel cord lengthening, tibial tendon transfer  09/2011    right foot  . Circumcision  1990    Family History family history is not on file. He was adopted. Family History is otherwise negative for migraines, seizures, cognitive impairment, blindness, deafness, birth defects, chromosomal disorder, autism.  Social History Social History   Social History  . Marital Status: Single    Spouse Name: N/A  . Number of Children: N/A  . Years of Education: N/A   Social History Main Topics  . Smoking status: Never Smoker   . Smokeless tobacco: Never Used  . Alcohol Use: No  . Drug Use: No  . Sexual Activity: No   Other Topics Concern  . None   Social History Narrative   Carl Bryant is a 25 year old male. He does not attend school. He lives with both parents  and he has one sister, Carl Bryant, who is 27 yo. He enjoys baseball and video games    Allergies No Known Allergies  Physical Exam BP 126/80 mmHg  Pulse 82  Ht 5' 6.75" (1.695 m)  Wt 196 lb 3.2 oz (88.996 kg)  BMI 30.98 kg/m2 General: well  developed, well nourished male, seated in exam room, in no evident distress  Head: head normocephalic and atraumatic.  Ears, Nose and Throat: Oropharynx benign  Neck: supple with no carotid or supraclavicular bruits.  Respiratory: lungs clear to auscultation  Cardiovascular: regular rate and rhythm, no murmurs  Musculoskeletal: Hemiatrophy and contractures of his right wrist and elbow, as well as both knees. The 3rd finger of the right hand is flexed toward the palm. Severe right spastic hemiparesis  Skin: mild facial acne  Trunk: normal alignment and mobility, no deformity   Neurologic Exam  Mental Status: Awake and fully alert. Mild cognitive delay. Able to answer basic questions appropriately. Mood and affect appropriate.  Cranial Nerves: Fundoscopic exam reveals sharp disc margins. Pupils equal, briskly reactive to light. Extraocular movements full without nystagmus. Visual fields full to confrontation. Hearing intact and symmetric to finger rub. Facial sensation intact. Face, tongue, palate move normally and symmetrically. Neck flexion and extension normal.  Motor: Hemiatrophy and contractures of his right wrist and elbow, as well as both knees. Severe right spastic hemiparesis. He has 4/5 strength proximally on the right and 3/5 strength distally. The left extremities have normal strength. He has limited ability to perform finger opposition on the right, with the thumb and forefinger barely touching.  Sensory: Intact to touch and temperature in all extremities.  Coordination: Coordination good on the left, poor on the right. Romberg negative.  Gait and Station: He has a spastic diplegic gait. His left foot over-pronates when he walks.  Reflexes: 1+ and symmetric. There is no clonus.   Impression 1. Right hemiparesis 2. Diplegic gait 3. Complex partial seizures with secondary generalization 4. Developmental language disorder 5. Dyscalculia 6. Attention deficit  disorder  Recommendations for plan of care [facility] Carl Bryant has neither had nor required imaging or lab studies since the last visit, other than when he was hospitalized for pneumonia in January 2017. He is a 25 year old young man with history of congenital right hemiparesis, diplegic gait, complex partial seizures with secondary generalization, developmental language disorder and dyscalculia, He has had no seizures and is tolerating Depakote well. I will make no changes in his treatment plan at this time. I completed the form for Carl Bryant to participate in Special Olympics. I will see him in follow up in 1 year or sooner if needed. Carl Bryant and his father agree with this plan  The medication list was reviewed and reconciled.  No changes were made in the prescribed medications today.  A complete medication list was provided to the patient's father.    Medication List       This list is accurate as of: 06/14/15 11:59 PM.  Always use your most recent med list.               acetaminophen 325 MG tablet  Commonly known as:  TYLENOL  Take 650 mg by mouth every 6 (six) hours as needed for mild pain.     DEPAKOTE 250 MG DR tablet  Generic drug:  divalproex  Take 1 tab by mouth 4 times daily.        Total time spent with the  patient was 25 minutes, of which 50% or more was spent in counseling and coordination of care.   Carl Bryant

## 2015-06-15 NOTE — Patient Instructions (Signed)
Continue your medication as you have been taking it.   Let me know if you have any seizures.  Please plan on returning for follow up in 1 year or sooner if needed.

## 2015-07-02 ENCOUNTER — Other Ambulatory Visit: Payer: Self-pay | Admitting: Family

## 2015-12-29 ENCOUNTER — Other Ambulatory Visit: Payer: Self-pay | Admitting: Family

## 2016-04-16 IMAGING — US US ABDOMEN COMPLETE
1 series · 14 of 25 positions shown · non-contrast
Comparison: None.

CLINICAL DATA: Nausea and vomiting for 3 days, abnormal LFTs,
cerebral palsy

EXAM:
ABDOMEN ULTRASOUND COMPLETE

[Series 1: us abdomen complete · 0.26mm/px · 14 of 71 slices shown]
[im 1/71]
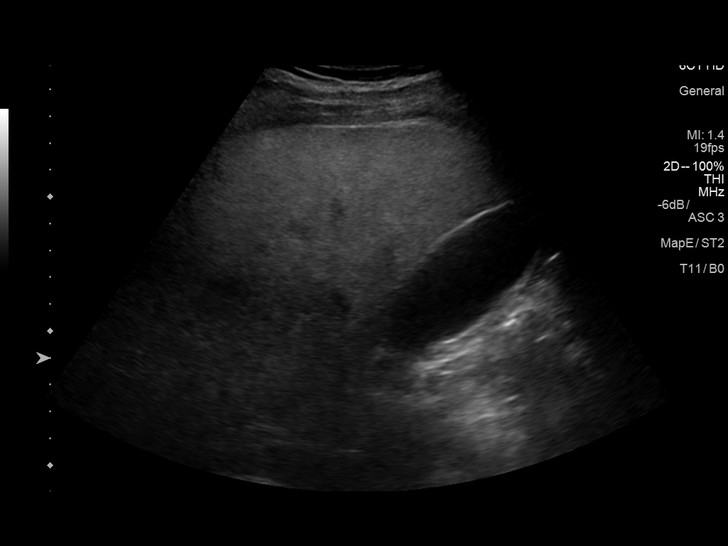
[im 6/71]
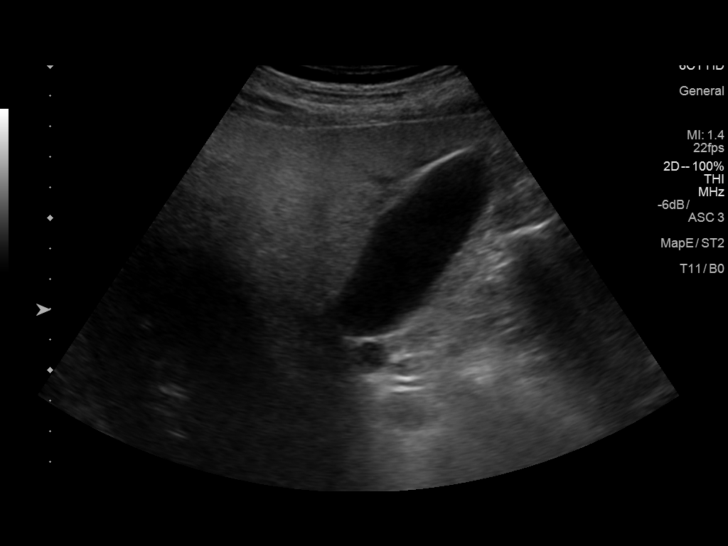
[im 12/71]
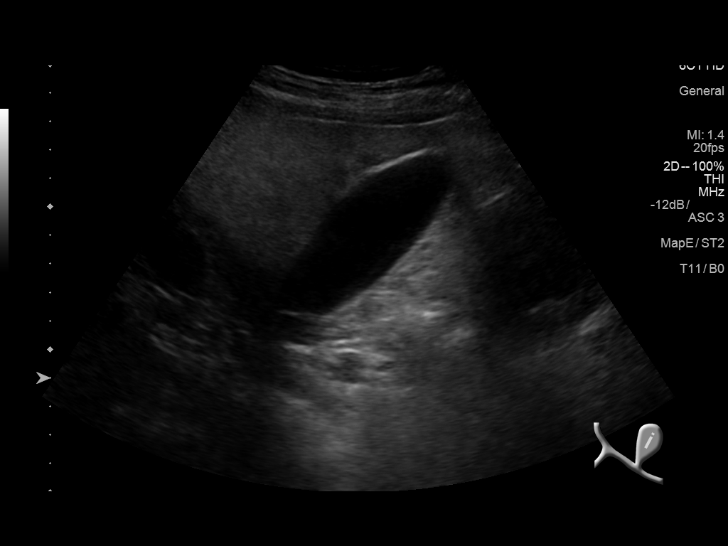
[im 18/71]
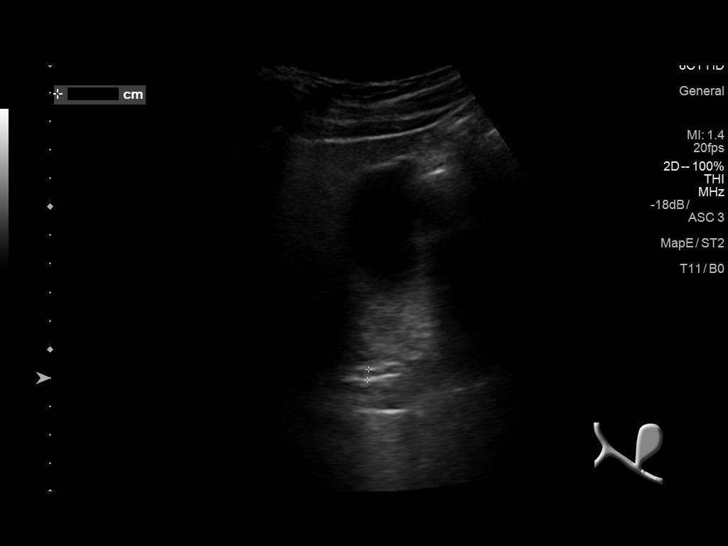
[im 24/71]
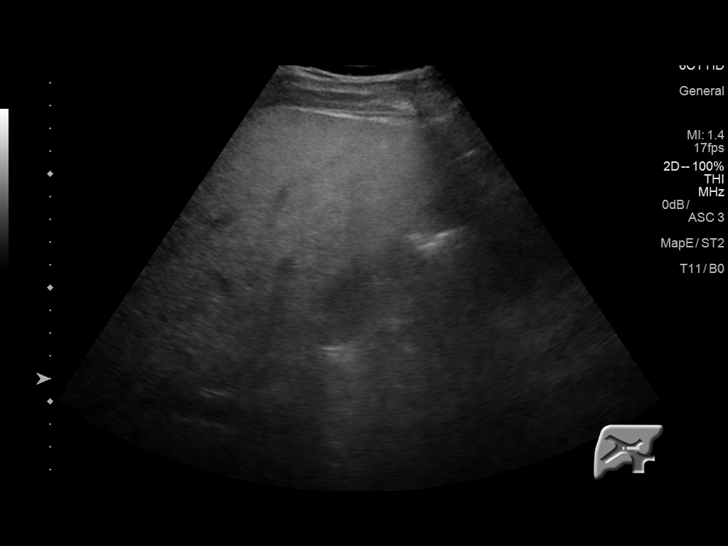
[im 27/71]
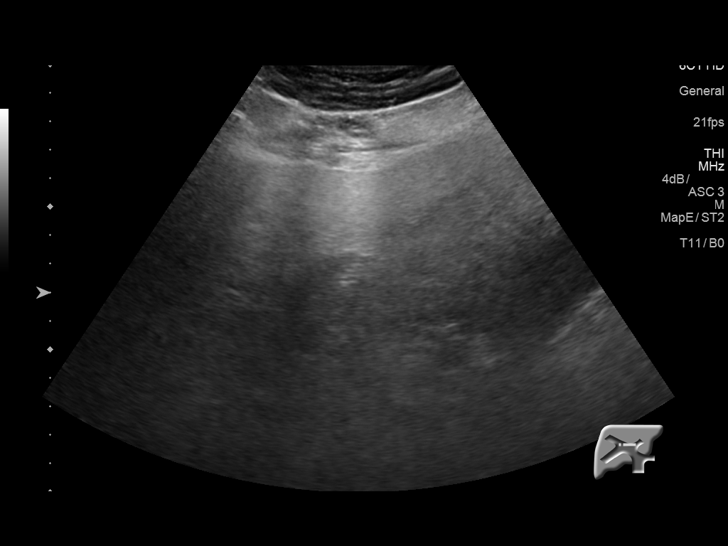
[im 33/71]
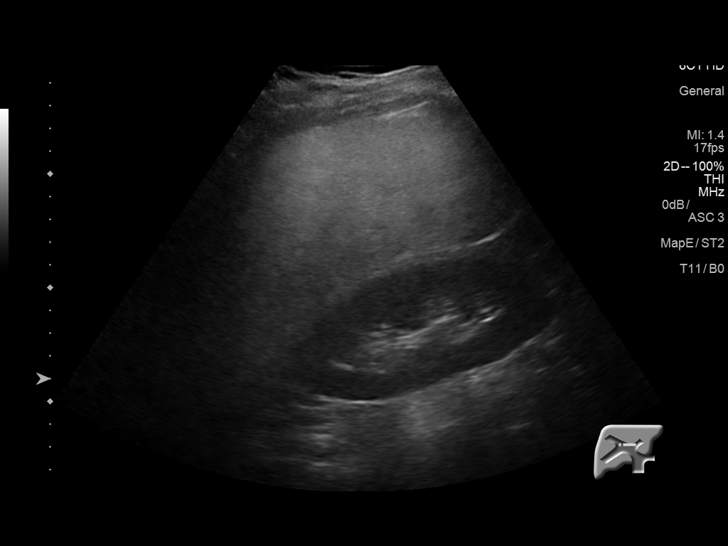
[im 38/71]
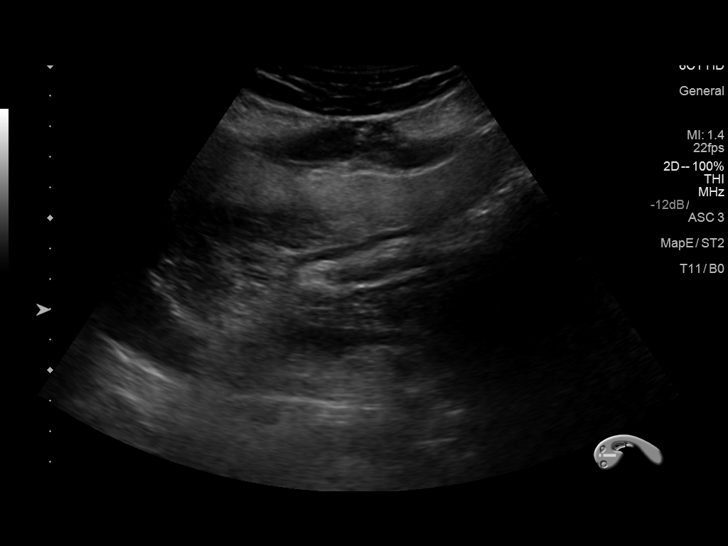
[im 44/71]
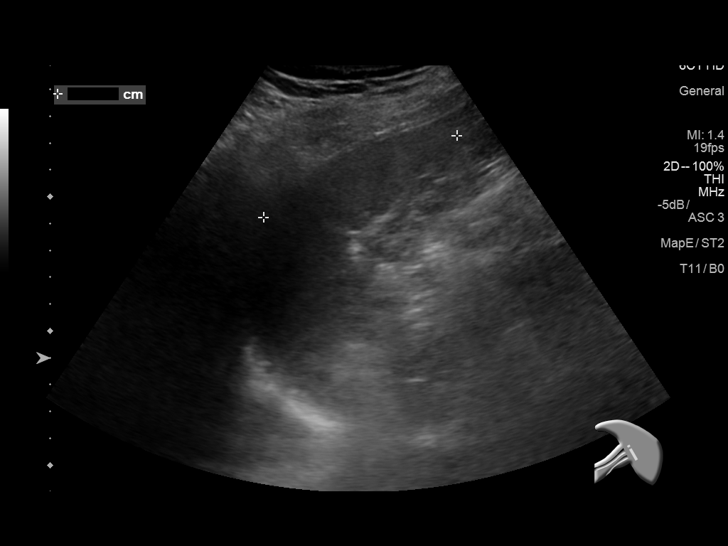
[im 47/71]
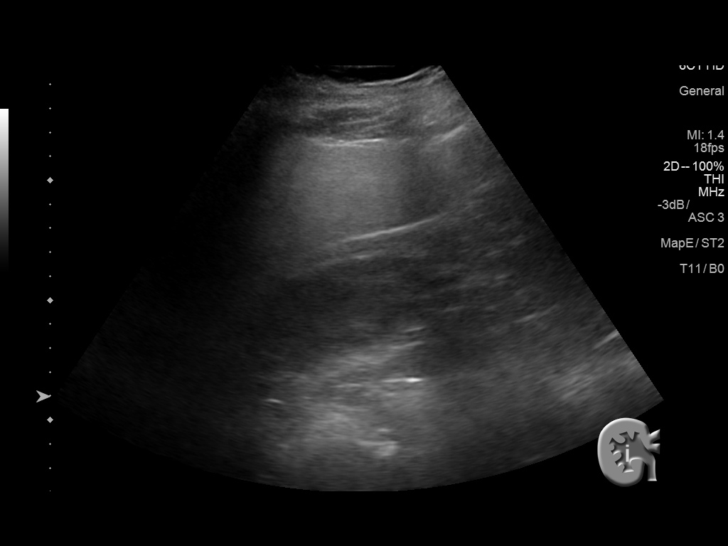
[im 53/71]
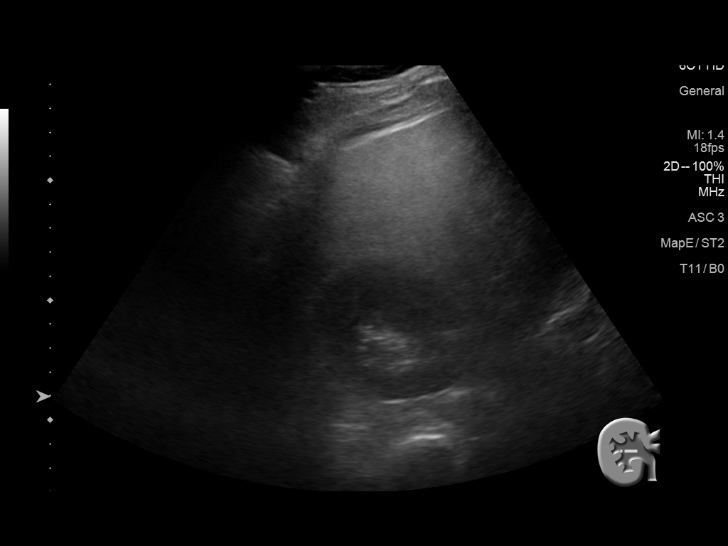
[im 59/71]
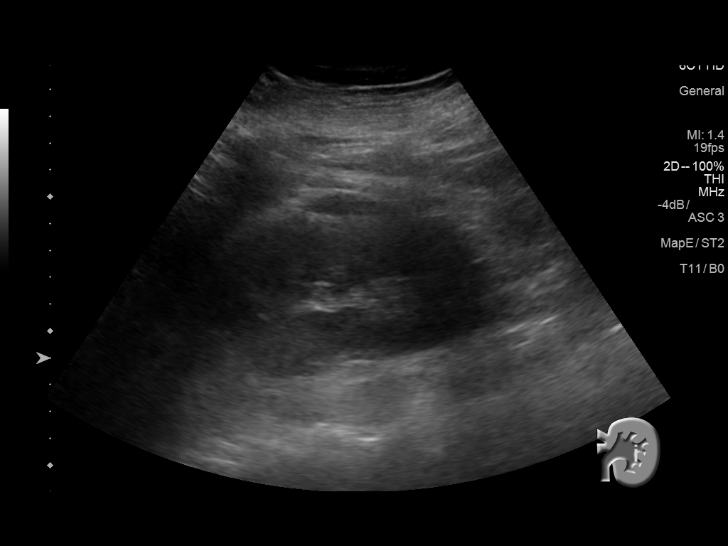
[im 65/71]
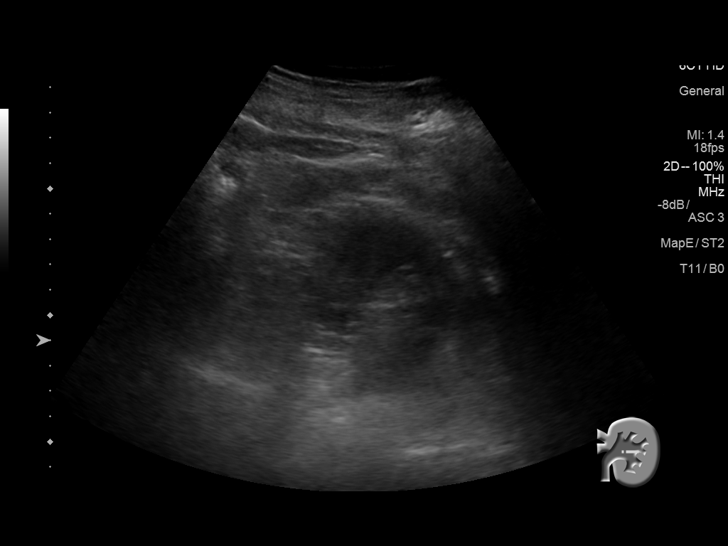
[im 71/71]
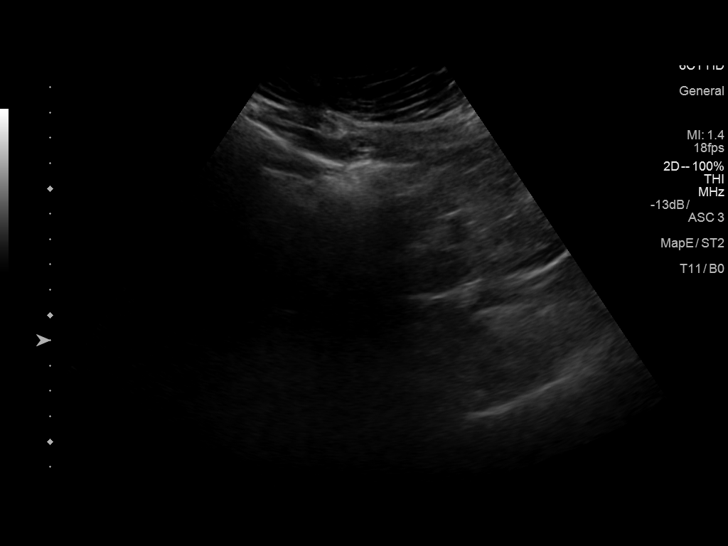

[14 of 25 positions shown; findings below may reference images not displayed]

FINDINGS: Gallbladder: No gallstones or wall thickening visualized. No
sonographic Murphy sign noted by sonographer.

Common bile duct: Diameter: 3.8 mm in diameter within normal limits.

Liver: Mild heterogeneous increased echogenicity of the liver
suspicious for fatty infiltration. No focal hepatic mass.

IVC: Not well seen due to bowel gas

Pancreas: Not well seen due to bowel gas

Spleen: Size and appearance within normal limits. Measures 7.8 cm in
length

Right Kidney: Length: 13 cm. Echogenicity within normal limits. No
mass or hydronephrosis visualized.

Left Kidney: Length: 13.2 cm. Echogenicity within normal limits. No
mass or hydronephrosis visualized.

Abdominal aorta: No aneurysm visualized. Measures up to 1.9 cm in
diameter.

Other findings: None.
IMPRESSION: 1. No gallstones are noted within gallbladder. Normal CBD.
Suboptimal exam due to abundant bowel gas.
2. Normal CBD.
3. Mild heterogeneous increased echogenicity of the liver suspicious
for fatty infiltration.
4. No hydronephrosis.
5. No aortic aneurysm.

## 2016-06-24 ENCOUNTER — Other Ambulatory Visit: Payer: Self-pay | Admitting: Family

## 2016-07-03 ENCOUNTER — Ambulatory Visit (INDEPENDENT_AMBULATORY_CARE_PROVIDER_SITE_OTHER): Payer: Medicaid Other | Admitting: Family

## 2016-07-03 ENCOUNTER — Encounter (INDEPENDENT_AMBULATORY_CARE_PROVIDER_SITE_OTHER): Payer: Self-pay | Admitting: Family

## 2016-07-03 VITALS — BP 120/80 | HR 80 | Ht 65.0 in | Wt 204.2 lb

## 2016-07-03 DIAGNOSIS — G40309 Generalized idiopathic epilepsy and epileptic syndromes, not intractable, without status epilepticus: Secondary | ICD-10-CM

## 2016-07-03 DIAGNOSIS — F809 Developmental disorder of speech and language, unspecified: Secondary | ICD-10-CM | POA: Diagnosis not present

## 2016-07-03 DIAGNOSIS — M2450 Contracture, unspecified joint: Secondary | ICD-10-CM | POA: Diagnosis not present

## 2016-07-03 DIAGNOSIS — G40209 Localization-related (focal) (partial) symptomatic epilepsy and epileptic syndromes with complex partial seizures, not intractable, without status epilepticus: Secondary | ICD-10-CM | POA: Diagnosis not present

## 2016-07-03 DIAGNOSIS — G808 Other cerebral palsy: Secondary | ICD-10-CM

## 2016-07-03 DIAGNOSIS — G830 Diplegia of upper limbs: Secondary | ICD-10-CM

## 2016-07-03 NOTE — Progress Notes (Signed)
Patient: Carl Bryant MRN: [id number] Sex: male DOB: 17-May-1990  Provider: Elveria Rising, NP Location of Care: [facility]  Note type: Routine return visit  History of Present Illness: Referral Source: Dr. Jacky Kindle History from: father Chief Complaint: follow up on seizures  LENZY KERSCHNER is a 26 y.o. 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 He also has attention-deficit disorder, mixed type but no longer takes medication for this since he is out of school. He has had no seizures since 01/17/1999. Spero was last seen Jun 14, 2015.  Today Tavon's reports that Lysander was doing well since he was last seen. 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 Koty bowls regularly with his father. 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   Matix continues to follow up with Dr. 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 He may need to have a plate removed from his wrist as well as surgery on the 3rd finger of this right hand that looks to be developing a trigger finger type deformity.   Jerris has been otherwise healthy since last seen. Neither Izzac nor his father have other health concerns about him today.  Review of Systems: Please see the HPI for neurologic and other pertinent review of systems. Otherwise, the following systems are noncontributory including constitutional, eyes, ears, nose and throat, cardiovascular, respiratory, gastrointestinal, genitourinary, musculoskeletal, skin, endocrine, hematologic/lymph, allergic/immunologic and psychiatric.   Past  Medical History:  Diagnosis Date  . Cerebral palsy (HCC)   . Congenital hemiparesis (HCC)   . Developmental language disorder   . Seizures (HCC)    Hospitalizations: No., Head Injury: No., Nervous System Infections: No., Immunizations up to date: No. Past Medical History Comments: right hemiparesis, diplegic gait, complex partial seizures with secondary generalization, developmental language disorder, dyscalculia  Surgical History Past Surgical History:  Procedure Laterality Date  . CIRCUMCISION  1990  . FOOT CAPSULE RELEASE W/ PERCUTANEOUS HEEL CORD LENGTHENING, TIBIAL TENDON TRANSFER  09/2011   right foot  . TENDON RELEASE  09/2011   right wrist, elbow    Family History family history is not on file. He was adopted. Family History is otherwise negative for migraines, seizures, cognitive impairment, blindness, deafness, birth defects, chromosomal disorder, autism.  Social History Social History   Social History  . Marital status: Single    Spouse name: N/A  . Number of children: N/A  . Years of education: N/A   Social History Main Topics  . Smoking status: Never Smoker  . Smokeless tobacco: Never Used  . Alcohol use No  . Drug use: No  . Sexual activity: No   Other Topics Concern  . None   Social History Narrative   Vance is a 26 year old male. He does not attend school. He lives with both parents and he has one sister, [name], who is 66 yo. He enjoys baseball and video games    Allergies No Known Allergies  Physical Exam BP 120/80   Pulse 80   Ht 5\' 5"  (1.651 m) Comment: approx measures at 5'5" unable to straigthen legs approx  Wt 204 lb 3.2 oz (92.6 kg)   BMI 33.98 kg/m  General: well developed, well nourished male, seated in exam room, in no evident distress  Head: head normocephalic and atraumatic.  Ears, Nose and Throat: Oropharynx benign  Neck: supple with no carotid or supraclavicular bruits.  Respiratory: lungs clear to auscultation   Cardiovascular: regular rate and rhythm, no murmurs  Musculoskeletal: Hemiatrophy and contractures of his right wrist and elbow, as well as both knees. The 3rd finger of the right hand is flexed toward the palm. Severe right spastic hemiparesis  Skin: mild facial acne  Trunk: normal alignment and mobility, no deformity   Neurologic Exam  Mental Status: Awake and fully alert. Mild cognitive delay. Able to answer basic questions appropriately. Mood and affect appropriate.  Cranial Nerves: Fundoscopic exam reveals sharp disc margins. Pupils equal, briskly reactive to light. Extraocular movements full without nystagmus. Visual fields full to confrontation. Hearing intact and symmetric to finger rub. Facial sensation intact. Face, tongue, palate move normally and symmetrically. Neck flexion and extension normal.  Motor: Hemiatrophy and contractures of his right wrist and elbow, as well as both knees. Severe right spastic hemiparesis. He has 4/5 strength proximally on the right and 3/5 strength distally. The left extremities have normal strength. He has limited ability to perform finger opposition on the right, with the thumb and forefinger barely touching.  Sensory: Intact to touch and temperature in all extremities.  Coordination: Coordination good on the left, poor on the right. Romberg negative.  Gait and Station: He has a spastic diplegic gait. His left foot over-pronates when he walks.  Reflexes: 1+ and symmetric. There is no clonus.   Impression 1. Right hemiparesis 2. Diplegic gait 3. Complex partial seizures with secondary generalization 4. Developmental language disorder 5. Dyscalculia 6. Attention deficit disorder   Recommendations for plan of care The [facility] were reviewed. [name] has neither had nor required imaging or lab studies since the last visit. He is a 26 year old young man with history of congenital right hemiparesis, diplegic gait, complex  partial seizures with secondary generalization, developmental language disorder and dyscalculia, He has had no seizures and is tolerating Depakote well. I will make no changes in his treatment plan at this time. I completed the form for [name] to participate in [employer] Olympics. I will see him in follow up in 1 year or sooner if needed. [name] and his father agree with this plan  The medication list was reviewed and reconciled.  No changes were made in the prescribed medications today.  A complete medication list was provided to the patient/caregiver.  Allergies as of 07/03/2016   No Known Allergies     Medication List       Accurate as of 07/03/16 10:58 AM. Always use your most recent med list.          acetaminophen 325 MG tablet Commonly known as:  TYLENOL Take 650 mg by mouth every 6 (six) hours as needed for mild pain.   DEPAKOTE 250 MG DR tablet Generic drug:  divalproex TAKE 1 TABLET BY MOUTH FOUR TIMES A DAY   omeprazole 40 MG capsule Commonly known as:  PRILOSEC Take 40 mg by mouth daily.       Dr. Sharene SkeansHickling was consulted regarding the patient.   Total time spent with the patient was 20 minutes, of which 50% or more was spent in counseling and coordination of care.   Elveria Risingina Marquail Bradwell NP-C

## 2016-07-05 NOTE — Patient Instructions (Signed)
Continue your medications as you have been taking them. Let me know if you have any seizures.  Please plan to return for follow up in 1 year or sooner if needed.

## 2016-07-27 ENCOUNTER — Other Ambulatory Visit (INDEPENDENT_AMBULATORY_CARE_PROVIDER_SITE_OTHER): Payer: Self-pay | Admitting: Family

## 2016-08-01 ENCOUNTER — Encounter: Payer: Self-pay | Admitting: Internal Medicine

## 2017-01-25 ENCOUNTER — Other Ambulatory Visit (INDEPENDENT_AMBULATORY_CARE_PROVIDER_SITE_OTHER): Payer: Self-pay | Admitting: Family

## 2017-07-23 ENCOUNTER — Other Ambulatory Visit (INDEPENDENT_AMBULATORY_CARE_PROVIDER_SITE_OTHER): Payer: Self-pay | Admitting: Family

## 2017-08-15 ENCOUNTER — Encounter (INDEPENDENT_AMBULATORY_CARE_PROVIDER_SITE_OTHER): Payer: Self-pay | Admitting: Family

## 2017-08-15 ENCOUNTER — Ambulatory Visit (INDEPENDENT_AMBULATORY_CARE_PROVIDER_SITE_OTHER): Payer: Medicaid Other | Admitting: Family

## 2017-08-15 VITALS — BP 118/78 | HR 80 | Ht 66.5 in | Wt 203.6 lb

## 2017-08-15 DIAGNOSIS — G830 Diplegia of upper limbs: Secondary | ICD-10-CM

## 2017-08-15 DIAGNOSIS — F809 Developmental disorder of speech and language, unspecified: Secondary | ICD-10-CM

## 2017-08-15 DIAGNOSIS — M2450 Contracture, unspecified joint: Secondary | ICD-10-CM

## 2017-08-15 DIAGNOSIS — G40309 Generalized idiopathic epilepsy and epileptic syndromes, not intractable, without status epilepticus: Secondary | ICD-10-CM | POA: Diagnosis not present

## 2017-08-15 DIAGNOSIS — G40209 Localization-related (focal) (partial) symptomatic epilepsy and epileptic syndromes with complex partial seizures, not intractable, without status epilepticus: Secondary | ICD-10-CM | POA: Diagnosis not present

## 2017-08-15 DIAGNOSIS — G808 Other cerebral palsy: Secondary | ICD-10-CM | POA: Diagnosis not present

## 2017-08-15 MED ORDER — DEPAKOTE 250 MG PO TBEC: 250.0000 mg | DELAYED_RELEASE_TABLET | Freq: Four times a day (QID) | ORAL | 5 refills | Status: DC

## 2017-08-15 NOTE — Progress Notes (Signed)
Patient: Carl Bryant MRN: [id number] Sex: male DOB: [birth year]  Provider: Elveria Risingina Daelyn Pettaway, NP [facility]  Note type: Routine return visit  History of Present Illness: Referral Source: Dr. Jacky KindleAronson [facility] and Dad Chief Complaint: Seizures  Carl Bryant Walth is a 27 y.o. 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 He also has attention deficit disorder mixed type but no longer requires medication since he is out of school. Carl Bryant was last seen July 03, 2016. He is taking and tolerating Depakote for his seizure disorder and has remained seizure free since 01/17/1999. Dad reports today that Carl Bryant has been having some pain in his right knee and had Botox injections in it last year. He said that he may be having orthopedic [facility] his right to help with finger mobility and his right knee to improve walking. Carl Bryant is active each day with his father doing activities. He is also involved in [employer] and basketball, and is involved with [employer] in the sport of skiiing.   Dad says that Carl Bryant has been generally healthy but has had a frequent cough over the last couple of weeks without any other sign of illness. He has tried treatment with allergy medications with no improvement. Dad has no other health concerns for Carl Bryant today other than previously mentioned.  Review of Systems: Please see the HPI for neurologic and other pertinent review of systems. Otherwise, all other systems were reviewed and were negative.    Past Medical History:  Diagnosis Date  . Cerebral palsy (HCC)   . Congenital hemiparesis (HCC)   . Developmental language disorder   . Seizures (HCC)    Hospitalizations: No., Head Injury: No., Nervous System Infections: No., Immunizations  up to date: Yes.   Past Medical History Comments: right hemiparesis, diplegic gait, complex partial seizures with secondary generalization, developmental language disorder, dyscalculia   Surgical History Past Surgical History:  Procedure Laterality Date  . CIRCUMCISION  1990  . FOOT CAPSULE RELEASE Bryant/ PERCUTANEOUS HEEL CORD LENGTHENING, TIBIAL TENDON TRANSFER  09/2011   right foot  . TENDON RELEASE  09/2011   right wrist, elbow    Family History family history is not on file. He was adopted. Family History is otherwise negative for migraines, seizures, cognitive impairment, blindness, deafness, birth defects, chromosomal disorder, autism.  Social History Social History   Socioeconomic History  . Marital status: Single    Spouse name: Not on file  . Number of children: Not on file  . Years of education: Not on file  . Highest education level: Not on file  Occupational History  . Not on file  Social Needs  . Financial resource strain: Not on file  . Food insecurity:    Worry: Not on file    Inability: Not on file  . Transportation needs:    Medical: Not on file    Non-medical: Not on file  Tobacco Use  . Smoking status: Never Smoker  . Smokeless tobacco: Never Used  Substance and Sexual Activity  . Alcohol use: No    Alcohol/week: 0.0 oz  . Drug use: No  . Sexual activity: Never  Lifestyle  . Physical activity:    Days per week: Not on file    Minutes per session: Not on file  . Stress: Not on file  Relationships  .  Social connections:    Talks on phone: Not on file    Gets together: Not on file    Attends religious service: Not on file    Active member of club or organization: Not on file    Attends meetings of clubs or organizations: Not on file    Relationship status: Not on file  Other Topics Concern  . Not on file  Social History Narrative   Jceon is a 27 year old male. He does not attend school. He lives with both parents and he has one sister, Carl Bryant,  who is 31 yo. He enjoys baseball and video games    Allergies No Known Allergies  Physical Exam BP 118/78   Pulse 80   Ht 5' 6.5" (1.689 m)   Wt 203 lb 9.6 oz (92.4 kg)   BMI 32.37 kg/m  General: well developed, well nourished man, seated in exam room, in no evident distress; sandy hair, hazel eyes, left handed Head: normocephalic and atraumatic. Oropharynx benign. No dysmorphic features. Neck: supple with no carotid bruits. Cardiovascular: regular rate and rhythm, no murmurs. Respiratory: Clear to auscultation bilaterally Abdomen: Bowel sounds present all four quadrants, abdomen soft, non-tender, non-distended.  Musculoskeletal: Hemiatrophy and contractures of the right wrist and elbow, as well as both knees. The 3rd finger of the right hand is flexed toward the palm. Severe spastic right hemiparesis Skin: no rashes or neurocutaneous lesions  Neurologic Exam Mental Status: Awake and fully alert. Evidence of cognitive delay. Speech is difficult to understand at times. He needs help from his father to answer most questions. Cooperative, able to participate in examination.  Cranial Nerves: Fundoscopic exam - red reflex present.  Unable to fully visualize fundus.  Pupils equal briskly reactive to light.  Extraocular movements and visual fields appear to be full but he had some difficulty following instructions. Hearing intact to whisper. Facial movements are symmetric.  Neck flexion and extension normal. Motor: Spastic right hemiparesis with hemiatrophy. Contractures of his right elbow and wrist, and both knees. He has limited ability to move the fingers of his right hand. Sensory: Intact to touch and temperature. Coordination: Coordination is good on the left, poor on the right. Romberg negative. Gait and Station: Able to rise from chair independently. Has spastic diplegic gait. His left foot overpronates when he walks.  Reflexes: 1 + and symmetric. Toes neutral. No  clonus  Impression 1.  Right spastic hemiparesis 2.  Diplegic gait 3.  Complex partial seizures with secondary generalization in good control 4.  Developmental language disorder 5.  Dyscalculia 6.  Attention deficit disorder   Recommendations for plan of care The patient's previous [facility] records were reviewed. Ishaaq has neither had nor required imaging or lab studies since the last visit. 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 He is taking and tolerating Depakote for his seizure disorder and will remain on his medication indefinitely. Dad brought forms to be completed for [employer] and [employer]. I will complete the forms and mail to him. I will otherwise see Kevion back in follow up in 1 year or sooner if needed.   The medication list was reviewed and reconciled.  No changes were made in the prescribed medications today.  A complete medication list was provided to the patient/caregiver.  Allergies as of 08/15/2017   No Known Allergies  Medication List        Accurate as of 08/15/17 11:42 AM. Always use your most recent med list.          acetaminophen 325 MG tablet Commonly known as:  TYLENOL Take 650 mg by mouth every 6 (six) hours as needed for mild pain.   DEPAKOTE 250 MG DR tablet Generic drug:  divalproex TAKE ONE TABLET BY MOUTH FOUR TIMES DAILY   omeprazole 40 MG capsule Commonly known as:  PRILOSEC Take 40 mg by mouth daily.        Total time spent with the patient was 20 minutes, of which 50% or more was spent in counseling and coordination of care.   Elveria Rising NP-C

## 2017-08-15 NOTE — Patient Instructions (Signed)
Thank you for coming in today.   Instructions for you until your next appointment are as follows: 1. Continue taking Depakote as you have been doing.  2. Let me know if you have any seizures 3. I will complete the sports forms and mail to you.  4. Please sign up for MyChart if you have not done so 5. Please plan to return for follow up in one year or sooner if needed.

## 2017-09-14 ENCOUNTER — Ambulatory Visit (INDEPENDENT_AMBULATORY_CARE_PROVIDER_SITE_OTHER): Payer: Medicaid Other | Admitting: Internal Medicine

## 2017-09-14 ENCOUNTER — Encounter: Payer: Self-pay | Admitting: Internal Medicine

## 2017-09-14 VITALS — BP 126/92 | HR 113 | Ht 66.5 in | Wt 205.2 lb

## 2017-09-14 DIAGNOSIS — R1310 Dysphagia, unspecified: Secondary | ICD-10-CM

## 2017-09-14 DIAGNOSIS — R05 Cough: Secondary | ICD-10-CM | POA: Diagnosis not present

## 2017-09-14 DIAGNOSIS — R059 Cough, unspecified: Secondary | ICD-10-CM

## 2017-09-14 LAB — POCT EXHALED NITRIC OXIDE: FENO LEVEL (PPB): 31

## 2017-09-14 MED ORDER — FLUTICASONE FUROATE-VILANTEROL 200-25 MCG/INH IN AEPB: 1.0000 | INHALATION_SPRAY | Freq: Every day | RESPIRATORY_TRACT | 11 refills | Status: DC

## 2017-09-14 NOTE — Patient Instructions (Addendum)
Cough  - sounds like post viral reactive cough but cannot exclude asthma  - possible that GERD and swallow issues are compoundng the problem  Plan - no need for cxr given fact cxr 08/24/17 at pcp office was reportedly normal - stop qvar - start high dose breo instead - no need for prednisone given lack of benefit x 2 courses - refer speech therapy for swallow evaluation - keep allergy eval mid sept 2019 - drink water, do sugar free lozenges, cut down on talking  - continue acid reflux course - ok for robitussin as needed  Followup 4 weeks with myself or APP to report progress

## 2017-09-14 NOTE — Progress Notes (Signed)
Subjective:    Patient ID: Carl Bryant, male    DOB: Jun 17, 1990, 27 y.o.   MRN: [id number]  HPI  27 year old male with cerebral palsybut somewhat functional with being able to swim and talkand eat although has had long-standing dysphagia with loss dysphagia evaluation many years ago. He will have dysphagia when he gulps something really fast. But with a straw he should be okay. In mid July 2019 he attended a summer camp for a week in [place], [place]. Where he returned he had a cough. Apparently there were no sick contacts. Since then the cough is continued unabated. It is now week 5 with a cough. 08/24/2017. Primary care physician Geoffry Paradise, MD Office and apparently a chest x-ray was clear. Patient was started on Singulair given a 1 week prednisone along with a Depo-Medrol  Shot but the cough continued. There followed up with primary care office on 09/07/2017 started on Qvar which she has been taking along with a Z-Pak and another one week prednisone course but again the cough did not improve. It is a dry cough it is present throughout the day.He has difficulty lying down because of the cough but once he starts sleeping the cough rarely wakes him up except occasionally the cough is significantly present in the morning. It seems to come from the throat. Taking an inhaler can transiently and temporarily relieve the cough. There also noticed some dysphagia but this is chronic and they do think the dysphagia might be contributing to the cough. There is no obvious postnasal drip but they do believe he has reflux disease and he is on treatment for that. He has upcoming appointment with the allergist 10/11/2017  Chest x-ray done in 2017 during hospitalization I personally visualized shows dense left-sided pneumonia. But the parents insist that chest x-ray in August 2019 was clear at the primary care office.   has a past medical history of Cerebral palsy (HCC), Congenital hemiparesis  (HCC), Developmental language disorder, and Seizures (HCC).   reports that he has never smoked. He has never used smokeless tobacco.  Past Surgical History:  Procedure Laterality Date  . CIRCUMCISION  1990  . FOOT CAPSULE RELEASE W/ PERCUTANEOUS HEEL CORD LENGTHENING, TIBIAL TENDON TRANSFER  09/2011   right foot  . TENDON RELEASE  09/2011   right wrist, elbow    No Known Allergies   There is no immunization history on file for this patient.  Family History  Adopted: Yes     Current Outpatient Medications:  .  acetaminophen (TYLENOL) 325 MG tablet, Take 650 mg by mouth every 6 (six) hours as needed for mild pain. , Disp: , Rfl:  .  DEPAKOTE 250 MG DR tablet, Take 1 tablet (250 mg total) by mouth 4 (four) times daily., Disp: 120 tablet, Rfl: 5 .  omeprazole (PRILOSEC) 40 MG capsule, Take 40 mg by mouth daily., Disp: , Rfl:     Review of Systems  HENT: Positive for congestion, sinus pressure, sinus pain, sneezing and trouble swallowing.   Respiratory: Positive for cough, chest tightness, shortness of breath and wheezing.   Cardiovascular: Positive for chest pain.  Allergic/Immunologic: Positive for environmental allergies.       Objective:   Physical Exam  Vitals:   09/14/17 1458  BP: (!) 126/92  Pulse: (!) 113  SpO2: 93%  Weight: 205 lb 3.2 oz (93.1 kg)  Height: 5' 6.5" (1.689 m)   Estimated body mass index is 32.62 kg/m as calculated from the  following:   Height as of this encounter: 5' 6.5" (1.689 m).   Weight as of this encounter: 205 lb 3.2 oz (93.1 kg).   General Appearance:    CP +  Head:    Normocephalic, without obvious abnormality, atraumatic  Eyes:    PERRL - yes, conjunctiva/corneas - clear      Ears:    Normal external ear canals, both ears  Nose:   NG tube - no  Throat:  ETT TUBE - no , OG tube - no  Neck:   Supple,  No enlargement/tenderness/nodules     Lungs:     Clear to auscultation bilaterally,   Chest wall:    No deformity  Heart:    S1  and S2 normal, no murmur, CVP - no.  Pressors - no  Abdomen:     Soft, no masses, no organomegaly  Genitalia:    Not done  Rectal:   not done  Extremities:   Extremities- intact but has deformities with tendon repair and some contractures     Skin:   Intact in exposed areas .      Neurologic:   Sedation - none -> RASS - +1 . Moves all 4s - yes.         Assessment & Plan:     ICD-10-CM   1. Cough R05 POCT EXHALED NITRIC OXIDE     Cough  - sounds like post viral reactive cough but cannot exclude asthma  - possible that GERD and swallow issues are compoundng the problem  Plan - no need for cxr given fact cxr 08/24/17 at pcp office was reportedly normal - stop qvar - start high dose breo instead - no need for prednisone given lack of benefit x 2 courses - refer speech therapy for swallow evaluation - keep allergy eval mid sept 2019 - drink water, do sugar free lozenges, cut down on talking  - continue acid reflux course - ok for robitussin as needed  Followup 4 weeks with myself or APP to report progress    Dr. Kalman ShanMurali Lenvil Swaim, M.D., [facility]C.P Pulmonary and Critical Care Medicine Staff Physician, [facility] Director - Interstitial Lung Disease  Program  [facility] [place], [place], [id number]  Pager: [phone], If no answer or between  15:00h - 7:00h: call [phone] Telephone: [phone]

## 2017-09-14 NOTE — Progress Notes (Signed)
  Subjective:     Patient ID: Carl Bryant, male   DOB: 06/13/1990, 27 y.o.   MRN: [id number]  PCP Carl Bryant, Richard, MD   HPI  Carl Bryant 09/14/2017  Chief Complaint  Patient presents with  . Consult    grew out of asthma around 10-cough, dry hacking-chest tightness and soreness        has a past medical history of Cerebral palsy (HCC), Congenital hemiparesis (HCC), Developmental language disorder, and Seizures (HCC).   reports that he has never smoked. He has never used smokeless tobacco.  Past Surgical History:  Procedure Laterality Date  . CIRCUMCISION  1990  . FOOT CAPSULE RELEASE W/ PERCUTANEOUS HEEL CORD LENGTHENING, TIBIAL TENDON TRANSFER  09/2011   right foot  . TENDON RELEASE  09/2011   right wrist, elbow    No Known Allergies   There is no immunization history on file for this patient.  Family History  Adopted: Yes     Current Outpatient Medications:  .  acetaminophen (TYLENOL) 325 MG tablet, Take 650 mg by mouth every 6 (six) hours as needed for mild pain. , Disp: , Rfl:  .  DEPAKOTE 250 MG DR tablet, Take 1 tablet (250 mg total) by mouth 4 (four) times daily., Disp: 120 tablet, Rfl: 5 .  omeprazole (PRILOSEC) 40 MG capsule, Take 40 mg by mouth daily., Disp: , Rfl:     Review of Systems     Objective:   Physical Exam Vitals:   09/14/17 1458  BP: (!) 126/92  Pulse: (!) 113  SpO2: 93%  Weight: 205 lb 3.2 oz (93.1 kg)  Height: 5' 6.5" (1.689 m)    Estimated body mass index is 32.62 kg/m as calculated from the following:   Height as of this encounter: 5' 6.5" (1.689 m).   Weight as of this encounter: 205 lb 3.2 oz (93.1 kg).     Assessment:    see other note   Plan:     See other note

## 2017-09-18 ENCOUNTER — Telehealth: Payer: Self-pay

## 2017-09-18 NOTE — Telephone Encounter (Signed)
PA initiated via [employer] for [employer]. Awaiting determination. PA # [id number].

## 2017-09-26 ENCOUNTER — Telehealth: Payer: Self-pay | Admitting: Internal Medicine

## 2017-09-26 DIAGNOSIS — R1310 Dysphagia, unspecified: Secondary | ICD-10-CM

## 2017-09-26 NOTE — Telephone Encounter (Signed)
Called and spoke to pt's father, Carl Bryant.  Carl Bryant is calling to f/u on swallow eval.  It does not appear that barium swallow was ordered during 09/14/17 OV. Order has now been placed.  Nothing further is needed.

## 2017-09-28 ENCOUNTER — Other Ambulatory Visit (HOSPITAL_COMMUNITY): Payer: Self-pay | Admitting: Internal Medicine

## 2017-09-28 DIAGNOSIS — R1310 Dysphagia, unspecified: Secondary | ICD-10-CM

## 2017-10-05 ENCOUNTER — Ambulatory Visit (HOSPITAL_COMMUNITY)
Admission: RE | Admit: 2017-10-05 | Discharge: 2017-10-05 | Disposition: A | Discharge status: Home / Self Care | Payer: Medicaid Other | Source: Ambulatory Visit | Attending: Internal Medicine | Admitting: Internal Medicine

## 2017-10-05 DIAGNOSIS — R1310 Dysphagia, unspecified: Secondary | ICD-10-CM | POA: Diagnosis present

## 2017-10-05 DIAGNOSIS — G809 Cerebral palsy, unspecified: Secondary | ICD-10-CM | POA: Insufficient documentation

## 2017-10-05 NOTE — Progress Notes (Signed)
Modified Barium Swallow Progress Note  Patient Details  Name: Carl Bryant MRN: [id number] Date of Birth: March 06, 1990  Today's Date: 10/05/2017  Modified Barium Swallow completed.  Full report located under Chart Review in the Imaging Section.  Brief recommendations include the following:  Clinical Impression  Pt present wtih mild oral and oropharyngeal deficits with slow mastication and bolus cohesion with solids including dental residual. Delayed swallow intiaition with thin liquids observed, but airway protection excellent with pt immediately sensing flash penetration with increased laryngeal closure and ejection. No frank penetration or aspiration occurred, expect any response to aspiration events to be adequate to eject when/if they do occur with impulsive intake. Risk of aspiration low. No SLP f/u or diet modificaiton recommended.    Swallow Evaluation Recommendations       SLP Diet Recommendations: Regular solids;Thin liquid   Liquid Administration via: Cup;Straw   Medication Administration: Whole meds with liquid           Postural Changes: Remain semi-upright after after feeds/meals (Comment)          Harlon DittyBonnie Tashianna Broome, MA CCC-SLP [phone]   Carl Bryant, Carl NearingBonnie Bryant 10/05/2017,1:37 PM

## 2017-10-11 ENCOUNTER — Ambulatory Visit: Payer: Medicaid Other | Admitting: Allergy & Immunology

## 2017-10-16 ENCOUNTER — Ambulatory Visit: Payer: Medicaid Other | Admitting: Internal Medicine

## 2018-02-11 ENCOUNTER — Telehealth: Payer: Self-pay | Admitting: Internal Medicine

## 2018-02-11 NOTE — Telephone Encounter (Signed)
Last seen in aug/sep 2019. Was supposed to finish sawllow eval (which he did and risk for aspiration is low). Please arrange for followup with an app

## 2018-02-11 NOTE — Telephone Encounter (Signed)
Attempted to call pt's father Loraine Leriche but the line rang twice and then received a busy tone.  Attempted to call again and was unable to reach pt's father Loraine Leriche. Left message for Loraine Leriche to return call.  When pt's father returns call, please schedule pt a f/u appt with a NP. Thanks!

## 2018-02-18 ENCOUNTER — Other Ambulatory Visit (INDEPENDENT_AMBULATORY_CARE_PROVIDER_SITE_OTHER): Payer: Self-pay | Admitting: Family

## 2018-02-19 NOTE — Telephone Encounter (Signed)
ATC pt, no answer. Left message for pt to call back.  

## 2018-02-19 NOTE — Telephone Encounter (Signed)
Patient's father returned call, scheduled OV with [place] for 02/21/2018.

## 2018-02-21 ENCOUNTER — Encounter: Payer: Self-pay | Admitting: Nurse Practitioner

## 2018-02-21 ENCOUNTER — Ambulatory Visit (INDEPENDENT_AMBULATORY_CARE_PROVIDER_SITE_OTHER): Payer: Medicaid Other | Admitting: Nurse Practitioner

## 2018-02-21 VITALS — BP 122/86 | HR 96 | Ht 66.5 in | Wt 204.0 lb

## 2018-02-21 DIAGNOSIS — R05 Cough: Secondary | ICD-10-CM | POA: Diagnosis not present

## 2018-02-21 DIAGNOSIS — R059 Cough, unspecified: Secondary | ICD-10-CM

## 2018-02-21 MED ORDER — PREDNISONE 10 MG PO TABS: ORAL_TABLET | ORAL | 0 refills | Status: DC

## 2018-02-21 MED ORDER — BECLOMETHASONE DIPROPIONATE 80 MCG/ACT IN AERS: 2.0000 | INHALATION_SPRAY | Freq: Two times a day (BID) | RESPIRATORY_TRACT | 2 refills | Status: DC

## 2018-02-21 NOTE — Progress Notes (Signed)
 @Patient  ID: Carl Bryant, male    DOB: 15-May-1990, 28 y.o.   MRN: [id number]  Chief Complaint  Patient presents with  . Follow-up    pt states cough has returned within the past two weeks. restarted proair and sample of qvar with relief in cough- cough is prod with green to yellow mucus. using proair and qvar BID.     Referring provider: Geoffry ParadiseAronson, Richard, MD  HPI  28 year old male never smoker with cerebral palsy followed by Dr. Marchelle Gearingamaswamy for recent ongoing cough.  Tests: Swallow eval 10/05/17  -Risk of aspiration low. No SLP f/u or diet modificaiton recommended.   OV 02/21/18 - follow up Presents for follow-up on cough today.  He was last seen by Dr. Marchelle Gearingamaswamy on 09/14/2017 for cough.  Ramaswamy noted that the cough is most likely viral but also ordered a swallow study.  Swallow study showed low aspiration risk with no diet modification recommended.  Patient [facility]  Fortunately patient cannot [facility].  He went back to using Qvar and pro-air as needed.  He is taking Singulair daily.  Is also taking Prilosec daily for GERD.  He states that his cough did resolve after last visit but over the past week his cough has returned.  Cough has been productive with green and yellow sputum.  States that symptoms have slightly worsened.  Currently on Cipro for a UTI.  He denies any fever.  He denies any shortness of breath, chest pain, or edema.   No Known Allergies  Immunization History  Administered Date(s) Administered  . Influenza Split 11/05/2017    Past Medical History:  Diagnosis Date  . Cerebral palsy (HCC)   . Congenital hemiparesis (HCC)   . Developmental language disorder   . Seizures (HCC)     Tobacco History: Social History   Tobacco Use  Smoking Status Never Smoker  Smokeless Tobacco Never Used   Counseling given: Not Answered   Outpatient Encounter Medications as of 02/21/2018  Medication Sig  . acetaminophen (TYLENOL)  325 MG tablet Take 650 mg by mouth every 6 (six) hours as needed for mild pain.   . beclomethasone (QVAR) 80 MCG/ACT inhaler Inhale 2 puffs into the lungs 2 (two) times daily for 30 days.  Marland Kitchen. DEPAKOTE 250 MG DR tablet TAKE 1 TABLET BY MOUTH 4 TIMES DAILY  . montelukast (SINGULAIR) 10 MG tablet TAKE 1 TABLET BY MOUTH AT BEDTIME FOR ASTHMA  . omeprazole (PRILOSEC) 40 MG capsule Take 40 mg by mouth daily.  Marland Kitchen. PROAIR HFA 108 (90 Base) MCG/ACT inhaler TAKE 2 PUFFS EVERY 4 TO 6 HOURS AS NEEDED FOR COUGH/WHEEZE  . [DISCONTINUED] beclomethasone (QVAR) 80 MCG/ACT inhaler Inhale 2 puffs into the lungs 2 (two) times daily.  . predniSONE (DELTASONE) 10 MG tablet Take 4 tabs for 2 days, then 3 tabs for 2 days, then 2 tabs for 2 days, then 1 tab for 2 days, then stop  . [DISCONTINUED] fluticasone furoate-vilanterol (BREO ELLIPTA) 200-25 MCG/INH AEPB Inhale 1 puff into the lungs daily. (Patient not taking: Reported on 02/21/2018)   No facility-administered encounter medications on file as of 02/21/2018.      Review of Systems  Review of Systems  Constitutional: Negative.  Negative for chills and fever.  HENT: Negative.  Negative for postnasal drip, sinus pressure and sinus pain.   Respiratory: Positive for cough. Negative for shortness of breath and wheezing.   Cardiovascular: Negative.  Negative for chest pain, palpitations and  leg swelling.  Gastrointestinal: Negative.   Allergic/Immunologic: Negative.   Neurological: Negative.   Psychiatric/Behavioral: Negative.        Physical Exam  BP 122/86 (BP Location: Left Arm, Cuff Size: Normal)   Pulse 96   Ht 5' 6.5" (1.689 m)   Wt 204 lb (92.5 kg)   SpO2 94%   BMI 32.43 kg/m   Wt Readings from Last 5 Encounters:  02/21/18 204 lb (92.5 kg)  09/14/17 205 lb 3.2 oz (93.1 kg)  08/15/17 203 lb 9.6 oz (92.4 kg)  07/03/16 204 lb 3.2 oz (92.6 kg)  06/14/15 196 lb 3.2 oz (89 kg)     Physical Exam Vitals signs and nursing note reviewed.    Constitutional:      General: He is not in acute distress.    Appearance: He is well-developed.  Cardiovascular:     Rate and Rhythm: Normal rate and regular rhythm.  Pulmonary:     Effort: Pulmonary effort is normal. No respiratory distress.     Breath sounds: Normal breath sounds. No wheezing or rhonchi.  Skin:    General: Skin is warm and dry.  Neurological:     Mental Status: He is alert and oriented to person, place, and time.       Assessment & Plan:   Cough Cough is most likely viral.  Patient is already on an antibiotic.  Swallow eval was negative.  Will order prednisone.  Patient Instructions  May continue Qvar  May continue proair Continue Singulair Please complete entire course of Cipro that was prescribed by PCP Will order prednisone taper (discussed possible tendonitis caution when taking cipro and prednisone together) Please follow up with Dr. Marchelle Gearing in 3 months or sooner if needed       Ivonne Andrew, NP 02/22/2018

## 2018-02-21 NOTE — Patient Instructions (Addendum)
May continue Qvar  May continue proair Continue Singulair Please complete entire course of Cipro that was prescribed by PCP Will order prednisone taper (discussed possible tendonitis caution when taking cipro and prednisone together) Please follow up with Dr. Marchelle Gearing in 3 months or sooner if needed

## 2018-02-22 ENCOUNTER — Telehealth: Payer: Self-pay

## 2018-02-22 ENCOUNTER — Encounter: Payer: Self-pay | Admitting: Nurse Practitioner

## 2018-02-22 DIAGNOSIS — R05 Cough: Secondary | ICD-10-CM | POA: Insufficient documentation

## 2018-02-22 DIAGNOSIS — R059 Cough, unspecified: Secondary | ICD-10-CM | POA: Insufficient documentation

## 2018-02-22 NOTE — Telephone Encounter (Signed)
Spoke with [facility] pharmacy. They confirmed the medication required a PA. This represents a change in formulary status. Message routed to [place].  Tonya - Can alternative medication be sent? Thank you.

## 2018-02-22 NOTE — Telephone Encounter (Signed)
Received denial for Qvar redihaler 36mcg/act.  Will send Archie Patten a note to see if we can send in another medication.

## 2018-02-22 NOTE — Telephone Encounter (Signed)
Please try to get the PA done on the Qvar thank you

## 2018-02-22 NOTE — Assessment & Plan Note (Signed)
Cough is most likely viral.  Patient is already on an antibiotic.  Swallow eval was negative.  Will order prednisone.  Patient Instructions  May continue Qvar  May continue proair Continue Singulair Please complete entire course of Cipro that was prescribed by PCP Will order prednisone taper (discussed possible tendonitis caution when taking cipro and prednisone together) Please follow up with Dr. Marchelle Gearing in 3 months or sooner if needed

## 2018-02-22 NOTE — Telephone Encounter (Signed)
Call made to [facility] PA initiated. PA # [id number]. Reference call number  # [id number]. Will route to [place] to F/U on PA. Per [place] [facility] allow 24/48 hours for decision.

## 2018-02-22 NOTE — Telephone Encounter (Signed)
This was not a new medication. I was just refilling it. He has been on this medicine. It should be approved.

## 2018-02-26 NOTE — Telephone Encounter (Signed)
lmtcb x1 for pt. 

## 2018-02-26 NOTE — Telephone Encounter (Signed)
Let us do pulmicort 2 puff twice dailuy

## 2018-02-26 NOTE — Telephone Encounter (Signed)
Called [place] [facility] at [phone] and was informed the PA for Qvar was denied because pt has not tried and failed the two covered alternatives: Pulmicort and Flovent hfa. Reference #: [id number].  MR please advise if to do an appeal or move forward with a covered alternative. Thanks.

## 2018-02-27 MED ORDER — BUDESONIDE 180 MCG/ACT IN AEPB: 2.0000 | INHALATION_SPRAY | Freq: Two times a day (BID) | RESPIRATORY_TRACT | 3 refills | Status: DC

## 2018-02-27 NOTE — Telephone Encounter (Signed)
Called and spoke with pt's father Carl Bryant regarding MR recommendations [name] that the DPR is needing corrected, as his name nor his wife is on the FYI to speak with Since he is the one the completed and signed DPR and emergency contact I was able to speak with him He advised that he will come in office to correct the DPR asap Advised him of MR recommendations today, he had no further concerns Placed order today for pulmicort 2 puffs BID Cancelled the Qvar off the medication list as patient is not taking it anymore due to insurance not covering Nothing further needed at this time.

## 2018-02-27 NOTE — Telephone Encounter (Signed)
LVM for patient to return call regards to MR recommendations.X1

## 2018-02-27 NOTE — Telephone Encounter (Signed)
Loraine Leriche, pt father, returned call - CB is [phone].

## 2018-03-04 ENCOUNTER — Other Ambulatory Visit: Payer: Self-pay | Admitting: Internal Medicine

## 2018-03-04 NOTE — Telephone Encounter (Signed)
Will call [place] [facility] in the morning since it is after 5pm.

## 2018-03-06 NOTE — Telephone Encounter (Signed)
Called and spoke with Carl Bryant from [facility] tracks. PA was started over the phone. PA was approved from todays date 03/06/18 until 03/01/2019. PA number is [id number] and reference number to the phone call is [id number]. Called patients father Carl Bryant, he is aware of approval. Called patients pharmacy and medication went through. Nothing further needed.

## 2018-05-23 ENCOUNTER — Ambulatory Visit: Payer: Medicaid Other | Admitting: Internal Medicine

## 2018-07-19 ENCOUNTER — Encounter: Payer: Self-pay | Admitting: Nurse Practitioner

## 2018-07-19 ENCOUNTER — Other Ambulatory Visit: Payer: Self-pay

## 2018-07-19 ENCOUNTER — Ambulatory Visit (INDEPENDENT_AMBULATORY_CARE_PROVIDER_SITE_OTHER): Payer: Medicaid Other | Admitting: Nurse Practitioner

## 2018-07-19 DIAGNOSIS — R05 Cough: Secondary | ICD-10-CM

## 2018-07-19 DIAGNOSIS — R059 Cough, unspecified: Secondary | ICD-10-CM

## 2018-07-19 NOTE — Patient Instructions (Signed)
Glad you are doing well May continue proair Continue Singulair  Please follow up with Dr. Chase Caller in 3 months or sooner if needed

## 2018-07-19 NOTE — Progress Notes (Signed)
 @Patient  ID: Carl Bryant, male    DOB: 02/18/1990, 28 y.o.   MRN: [id number]  No chief complaint on file.   Referring provider: Geoffry ParadiseAronson, Richard, MD  HPI 28 year old male never smoker with cerebral palsy followed by Dr. Marchelle Gearingamaswamy for recent ongoing cough.  Tests: Swallow eval 10/05/17  -Risk of aspiration low. No SLP f/u or diet modificaiton recommended.   OV 07/19/18 - Follow up  Patient presents today for follow-up visit for chronic cough.  His mother is with him today and states that he has been doing well and this is been a stable interval for him.  He was last seen by me on 02/21/2018.  Patient has not been using Pulmicort in over a month Singulair.  Patient does use pro-air as needed but does not need it often.  Patient does still take Prilosec daily.  Patient's mother states that he does still have slight cough at times but he is much improved.  His cough is nonproductive. Denies f/c/s, n/v/d, hemoptysis, PND, leg swelling.      No Known Allergies  Immunization History  Administered Date(s) Administered  . Influenza Split 11/05/2017    Past Medical History:  Diagnosis Date  . Cerebral palsy (HCC)   . Congenital hemiparesis (HCC)   . Developmental language disorder   . Seizures (HCC)     Tobacco History: Social History   Tobacco Use  Smoking Status Never Smoker  Smokeless Tobacco Never Used   Counseling given: Not Answered   Outpatient Encounter Medications as of 07/19/2018  Medication Sig  . acetaminophen (TYLENOL) 325 MG tablet Take 650 mg by mouth every 6 (six) hours as needed for mild pain.   Marland Kitchen. DEPAKOTE 250 MG DR tablet TAKE 1 TABLET BY MOUTH 4 TIMES DAILY  . montelukast (SINGULAIR) 10 MG tablet TAKE 1 TABLET BY MOUTH AT BEDTIME FOR ASTHMA  . nystatin (MYCOSTATIN) 100000 UNIT/ML suspension SWISH OR GARGLE AND SPIT 10 CC 3-4 TIMES A DAY AS NEEDED  . omeprazole (PRILOSEC) 40 MG capsule Take 40 mg by mouth daily.  . budesonide (PULMICORT) 180 MCG/ACT  inhaler Inhale 2 puffs into the lungs 2 (two) times daily. (Patient not taking: Reported on 07/19/2018)  . PROAIR HFA 108 (90 Base) MCG/ACT inhaler TAKE 2 PUFFS EVERY 4 TO 6 HOURS AS NEEDED FOR COUGH/WHEEZE  . [DISCONTINUED] predniSONE (DELTASONE) 10 MG tablet Take 4 tabs for 2 days, then 3 tabs for 2 days, then 2 tabs for 2 days, then 1 tab for 2 days, then stop (Patient not taking: Reported on 07/19/2018)   No facility-administered encounter medications on file as of 07/19/2018.      Review of Systems  Review of Systems  Constitutional: Negative.  Negative for chills and fever.  HENT: Negative.   Respiratory: Positive for cough (occassional non productive). Negative for shortness of breath and wheezing.   Cardiovascular: Negative.  Negative for chest pain, palpitations and leg swelling.  Gastrointestinal: Negative.   Allergic/Immunologic: Negative.   Neurological: Negative.   Psychiatric/Behavioral: Negative.        Physical Exam  BP 116/68 (BP Location: Left Arm, Patient Position: Sitting, Cuff Size: Normal)   Pulse (!) 104   Temp 98.5 F (36.9 C)   Ht 5\' 7"  (1.702 m)   Wt 204 lb 3.2 oz (92.6 kg)   SpO2 96%   BMI 31.98 kg/m   Wt Readings from Last 5 Encounters:  07/19/18 204 lb 3.2 oz (92.6 kg)  02/21/18 204 lb (92.5 kg)  09/14/17  205 lb 3.2 oz (93.1 kg)  08/15/17 203 lb 9.6 oz (92.4 kg)  07/03/16 204 lb 3.2 oz (92.6 kg)     Physical Exam Vitals signs and nursing note reviewed.  Constitutional:      General: He is not in acute distress.    Appearance: He is well-developed.  Cardiovascular:     Rate and Rhythm: Normal rate and regular rhythm.  Pulmonary:     Effort: Pulmonary effort is normal. No respiratory distress.     Breath sounds: Normal breath sounds. No wheezing or rhonchi.  Musculoskeletal:        General: No swelling.  Skin:    General: Skin is warm and dry.  Neurological:     Mental Status: He is alert and oriented to person, place, and time.        Assessment & Plan:   Cough Patient presents today for follow-up visit for chronic cough.  His mother is with him today and states that he has been doing well and this is been a stable interval for him.  He was last seen by me on 02/21/2018.  Patient has not been using Pulmicort in over a month Singulair.  Patient does use pro-air as needed but does not need it often.  Patient does still take Prilosec daily.  Patient's mother states that he does still have slight cough at times but he is much improved.  His cough is nonproductive.  Patient Instructions  Glad you are doing well May continue proair Continue Singulair  Please follow up with Dr. Chase Caller in 3 months or sooner if needed     Fenton Foy, NP 07/19/2018

## 2018-07-19 NOTE — Assessment & Plan Note (Signed)
Patient presents today for follow-up visit for chronic cough.  His mother is with him today and states that he has been doing well and this is been a stable interval for him.  He was last seen by me on 02/21/2018.  Patient has not been using Pulmicort in over a month Singulair.  Patient does use pro-air as needed but does not need it often.  Patient does still take Prilosec daily.  Patient's mother states that he does still have slight cough at times but he is much improved.  His cough is nonproductive.  Patient Instructions  Glad you are doing well May continue proair Continue Singulair  Please follow up with Dr. Chase Caller in 3 months or sooner if needed

## 2018-08-16 ENCOUNTER — Other Ambulatory Visit: Payer: Self-pay

## 2018-08-16 ENCOUNTER — Encounter (INDEPENDENT_AMBULATORY_CARE_PROVIDER_SITE_OTHER): Payer: Self-pay | Admitting: Family

## 2018-08-16 ENCOUNTER — Ambulatory Visit (INDEPENDENT_AMBULATORY_CARE_PROVIDER_SITE_OTHER): Payer: Medicaid Other | Admitting: Family

## 2018-08-16 VITALS — BP 130/90 | HR 72 | Ht 66.5 in | Wt 205.8 lb

## 2018-08-16 DIAGNOSIS — G40209 Localization-related (focal) (partial) symptomatic epilepsy and epileptic syndromes with complex partial seizures, not intractable, without status epilepticus: Secondary | ICD-10-CM

## 2018-08-16 DIAGNOSIS — G40309 Generalized idiopathic epilepsy and epileptic syndromes, not intractable, without status epilepticus: Secondary | ICD-10-CM

## 2018-08-16 MED ORDER — DEPAKOTE 250 MG PO TBEC: DELAYED_RELEASE_TABLET | ORAL | 5 refills | Status: DC

## 2018-08-16 NOTE — Patient Instructions (Signed)
Thank you for coming in today.   Instructions for you until your next appointment are as follows: 1. Continue taking your Depakote as you have been doing 2. Let me know if you have any seizures.  3. Please sign up for MyChart if you have not done so 4. Please plan to return for follow up in one year or sooner if needed.

## 2018-08-16 NOTE — Progress Notes (Signed)
Carl Bryant   MRN:  [id number]  04-26-1990   Provider: Rockwell Germany NP-C Location of Care: [facility] Child Neurology  Visit type: Routine visit  Last visit: 08/15/2017  Referral source: Dr. 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 He also has history of attention deficit disorder mixed type but no longer requires medication since he is out of school. He is taking and tolerating Depakote and has remained seizure free since 01/17/1999. He is treated by Dr Carl Bryant at [facility] for his contractures. He receives Botox in his right thigh.   Today's concerns: Mom reports today that Carl Bryant has remained seizure free. He has pain in his right wrist and hand, as well as his right knee from contractures, and says that Dr Carl Bryant recommends ongoing Botox therapy. Carl Bryant has been restless with the Covid 19 pandemic and Mom says that it is difficult to find things to occupy his time with the current restrictions. He is usually very active with attending camps and participating in [place] during the summer and misses these activities.   Carl Bryant had a cough and fever in January, right after the family returned home from a cruise. He has been otherwise generally healthy since he was last seen. Neither Camil nor his mother have other health concerns for him today other than previously mentioned.   Review of systems: Please see HPI for neurologic and other pertinent review of systems. Otherwise all other systems were reviewed and were negative.  Problem List: Patient Active Problem List   Diagnosis Date Noted  . Cough 02/22/2018  . CAP (community acquired pneumonia) 01/25/2015  . Sepsis ([place]) 01/25/2015  . AKI (acute kidney injury) ([place]) 01/25/2015  . Nausea & vomiting 01/25/2015  .  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 Partial epilepsy with impairment of consciousness ([place]) 05/30/2012  . Congenital hemiparesis ([place]) 05/30/2012  . Diplegia ([place]) 05/30/2012  . Developmental language disorder 05/30/2012     Past Medical History:  Diagnosis Date  . Cerebral palsy ([place])   . Congenital hemiparesis ([place])   . Developmental language disorder   . Seizures ([place])     Past medical history comments: See HPI Copied from previous record: right hemiparesis, diplegic gait, complex partial seizures with secondary generalization, developmental language disorder, dyscalculia  Surgical history: Past Surgical History:  Procedure Laterality Date  . CIRCUMCISION  1990  . FOOT CAPSULE RELEASE W/ PERCUTANEOUS HEEL CORD LENGTHENING, TIBIAL TENDON TRANSFER  09/2011   right foot  . TENDON RELEASE  09/2011   right wrist, elbow     Family history: family history is not on file. He was adopted.   Social history: Social History   Socioeconomic History  . Marital status: Single    Spouse name: Not on file  . Number of children: Not on file  . Years of education: Not on file  . Highest education level: Not on file  Occupational History  . Not on file  Social Needs  . Financial resource strain: Not on file  . Food insecurity    Worry: Not on file    Inability: Not on file  . Transportation needs    Medical: Not on file    Non-medical: Not on file  Tobacco Use  . Smoking status: Never Smoker  . Smokeless tobacco: Never Used  Substance  and Sexual Activity  . Alcohol use: No    Alcohol/week: 0.0 standard drinks  . Drug use: No  . Sexual activity: Never  Lifestyle  . Physical activity    Days per week: Not on file    Minutes per session: Not on file  . Stress: Not on file  Relationships  . Social Musicianconnections    Talks on phone: Not on file    Gets together: Not on file    Attends religious service: Not on file    Active  member of club or organization: Not on file    Attends meetings of clubs or organizations: Not on file    Relationship status: Not on file  . Intimate partner violence    Fear of current or ex partner: Not on file    Emotionally abused: Not on file    Physically abused: Not on file    Forced sexual activity: Not on file  Other Topics Concern  . Not on file  Social History Narrative   Carl Bryant is a 28 year old male. He does not attend school. He lives with both parents and he has one sister, Carl Bryant, who is 30 yo. He enjoys baseball and video games     Past/failed meds:   Allergies: No Known Allergies    Immunizations: Immunization History  Administered Date(s) Administered  . Influenza Split 11/05/2017      Diagnostics/Screenings:    Physical Exam: BP 130/90   Pulse 72   Ht 5' 6.5" (1.689 m)   Wt 205 lb 12.8 oz (93.4 kg)   BMI 32.72 kg/m   General: well developed, well nourished man, seated in exam room in no evident distress; sandy hair, hazel eyes, left handed Head: normocephalic and atraumatic. Oropharynx benign. No dysmorphic features. Neck: supple with no carotid bruits. Cardiovascular: regular rate and rhythm, no murmurs. Respiratory: Clear to auscultation bilaterally Abdomen: Bowel sounds present all four quadrants, abdomen soft, non-tender, non-distended. No hepatosplenomegaly or masses palpated. Musculoskeletal: Hemiatrophy and contractures of the right wrist and elbow, as well as both knees. The 3rd finger of the right hand is flexed toward the palm. Severe spastic right hemiparesis. Skin: no rashes or neurocutaneous lesions  Neurologic Exam Mental Status: Awake and fully alert. Evidence of cognitive delay. Has dysarthria and speech is difficult to understand at times. He looks to his mother for help in answering questions. Cooperative and participates in examination. Cranial Nerves: Fundoscopic exam - red reflex present.  Unable to fully visualize fundus.   Pupils equal briskly reactive to light.  Turns to localize faces and objects in the periphery. Turns to localize sounds in the periphery. Extraocular movements appear full but he had some difficulty answering questions. Facial movements are symmetric. Neck flexion and extension normal. Motor: Spastic right hemiparesis with hemiatrophy. Contractures of the right elbow and wrist, bath knees. He has limited ability to move the fingers of the right hand. Sensory: Intact to touch and temperature Coordination: Coordination is fairly good on the left, very clumsy on the right.  Gait and Station: Able to stand and bear weight. Has spastic diplegic gait. The left foot over pronates when he walks.  Reflexes: 1+ and symmetric. Toes neutral. No clonus  Impression: 1. Right spastic hemiparesis 2. Diplegic gait 3. Complex partial seizures with secondary generalization 4. Developmental language disorder 5. Dyscalculia 6. Attention deficit disorder, mixed type  Recommendations for plan of care: [facility]. Carl Bryant has neither had nor required imaging or  lab studies since the last visit. He is a 28 year old man with history of spastic right hemiparesis, diplegic gait, complex partial seizures with secondary generalization, developmental lanaguage disorder, dyscalculia and attention deficit disorder mixed type. He is taking and tolerating Depakote and has remained seizure free since 01/17/1999. He will continue on his medication without change and will return for follow up in 1 year or sooner if needed. Carl Bryant and his mother agreed with the plans made today.   The medication list was reviewed and reconciled. No changes were made in the prescribed medications today. A complete medication list was provided to the patient.  Allergies as of 08/16/2018   No Known Allergies     Medication List       Accurate as of August 16, 2018 12:08 PM. If you have any questions, ask your  nurse or doctor.        acetaminophen 325 MG tablet Commonly known as: TYLENOL Take 650 mg by mouth every 6 (six) hours as needed for mild pain.   budesonide 180 MCG/ACT inhaler Commonly known as: PULMICORT Inhale 2 puffs into the lungs 2 (two) times daily.   Depakote 250 MG DR tablet Generic drug: divalproex TAKE 1 TABLET BY MOUTH 4 TIMES DAILY   montelukast 10 MG tablet Commonly known as: SINGULAIR TAKE 1 TABLET BY MOUTH AT BEDTIME FOR ASTHMA   nystatin 100000 UNIT/ML suspension Commonly known as: MYCOSTATIN SWISH OR GARGLE AND SPIT 10 CC 3-4 TIMES A DAY AS NEEDED   omeprazole 40 MG capsule Commonly known as: PRILOSEC Take 40 mg by mouth daily.   ProAir HFA 108 (90 Base) MCG/ACT inhaler Generic drug: albuterol TAKE 2 PUFFS EVERY 4 TO 6 HOURS AS NEEDED FOR COUGH/WHEEZE        Total time spent with the patient was 20 minutes, of which 50% or more was spent in counseling and coordination of care.  Elveria Risingina Zaneta Lightcap NP-C [facility] Ph. [phone] Fax [phone]

## 2019-01-27 ENCOUNTER — Telehealth (INDEPENDENT_AMBULATORY_CARE_PROVIDER_SITE_OTHER): Payer: Self-pay | Admitting: Family

## 2019-01-27 NOTE — Telephone Encounter (Signed)
I called Dad and let him know that the letter for [place] will be ready for pickup today. TG

## 2019-01-27 NOTE — Telephone Encounter (Signed)
  Who's calling (name and relationship to patient) : [name], [name] Best contact number: [phone] Provider they see: Blane Ohara  Reason for call: [name] has been summoned to jury duty, dad needs a letter stating he will not be able to serve due to his medical condition.      PRESCRIPTION REFILL ONLY  Name of prescription:  Pharmacy:

## 2019-02-23 ENCOUNTER — Other Ambulatory Visit (INDEPENDENT_AMBULATORY_CARE_PROVIDER_SITE_OTHER): Payer: Self-pay | Admitting: Family

## 2019-02-23 DIAGNOSIS — G40309 Generalized idiopathic epilepsy and epileptic syndromes, not intractable, without status epilepticus: Secondary | ICD-10-CM

## 2019-02-23 DIAGNOSIS — G40209 Localization-related (focal) (partial) symptomatic epilepsy and epileptic syndromes with complex partial seizures, not intractable, without status epilepticus: Secondary | ICD-10-CM

## 2019-02-24 NOTE — Telephone Encounter (Signed)
Please send to the pharmacy °

## 2019-08-13 ENCOUNTER — Other Ambulatory Visit: Payer: Self-pay

## 2019-08-13 ENCOUNTER — Encounter (INDEPENDENT_AMBULATORY_CARE_PROVIDER_SITE_OTHER): Payer: Self-pay | Admitting: Family

## 2019-08-13 ENCOUNTER — Ambulatory Visit (INDEPENDENT_AMBULATORY_CARE_PROVIDER_SITE_OTHER): Payer: Medicaid Other | Admitting: Family

## 2019-08-13 VITALS — BP 122/80 | HR 80 | Ht 66.5 in | Wt 207.8 lb

## 2019-08-13 DIAGNOSIS — G40309 Generalized idiopathic epilepsy and epileptic syndromes, not intractable, without status epilepticus: Secondary | ICD-10-CM | POA: Diagnosis not present

## 2019-08-13 DIAGNOSIS — G830 Diplegia of upper limbs: Secondary | ICD-10-CM | POA: Diagnosis not present

## 2019-08-13 DIAGNOSIS — F809 Developmental disorder of speech and language, unspecified: Secondary | ICD-10-CM

## 2019-08-13 DIAGNOSIS — G40209 Localization-related (focal) (partial) symptomatic epilepsy and epileptic syndromes with complex partial seizures, not intractable, without status epilepticus: Secondary | ICD-10-CM

## 2019-08-13 DIAGNOSIS — M2450 Contracture, unspecified joint: Secondary | ICD-10-CM

## 2019-08-13 DIAGNOSIS — G808 Other cerebral palsy: Secondary | ICD-10-CM | POA: Diagnosis not present

## 2019-08-13 NOTE — Progress Notes (Signed)
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 He also has history of attention deficit disorder mixed type but no longer requires medication since he is out of school. He is taking and tolerating Depakote and has remained seizure free since 01/17/1999. He is [facility] by Dr Laurice Record [facility] for his contractures. He receives Botox in his right thigh.   Today's concerns: Carl Bryant and his mother report that he has remained seizure free since his last visit. He has been doing well and is looking forward to competing in [employer] Olympics. Mom brought a from for completion today.   Carl Bryant has been otherwise generally healthy since he was last seen. Neither he nor mother has other health concerns for him today other than previously mentioned.  Review of systems: Please see HPI for neurologic and other pertinent review of systems. Otherwise all other systems were reviewed and were negative.  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   Generalized convulsive epilepsy (HCC) 05/30/2012   Partial epilepsy with impairment of consciousness (HCC) 05/30/2012   Congenital hemiparesis (HCC) 05/30/2012   Diplegia (HCC) 05/30/2012   Developmental language disorder 05/30/2012     Past Medical History:  Diagnosis Date   Cerebral  palsy (HCC)    Congenital hemiparesis (HCC)    Developmental language disorder    Seizures (HCC)     Past medical history comments: See HPI Copied from previous record: right hemiparesis, diplegic gait, complex partial seizures with secondary generalization, developmental language disorder, dyscalculia  Surgical history: Past Surgical History:  Procedure Laterality Date   CIRCUMCISION  1990   FOOT CAPSULE RELEASE W/ PERCUTANEOUS HEEL CORD LENGTHENING, TIBIAL TENDON TRANSFER  09/2011   right foot   TENDON RELEASE  09/2011   right wrist, elbow     Family history: family history is not on file. He was adopted.   Social history: Social History   Socioeconomic History   Marital status: Single    Spouse name: Not on file   Number of children: Not on file   Years of education: Not on file   Highest education level: Not on file  Occupational History   Not on file  Tobacco Use   Smoking status: Never Smoker   Smokeless tobacco: Never Used  Substance and Sexual Activity   Alcohol use: No    Alcohol/week: 0.0 standard drinks   Drug use: No   Sexual activity: Never  Other Topics Concern   Not on file  Social History Narrative   Carl Bryant is a 29 year old male. He does not attend school. He lives with both parents and he has one sister, [name], who is 39 yo. He enjoys baseball and video games   Social Determinants of Health   Financial Resource Strain:    Difficulty of Paying Living Expenses:   Food Insecurity:    Worried About Programme researcher, broadcasting/film/video in  the Last Year:    Barista in the Last Year:   Transportation Needs:    Freight forwarder (Medical):    Lack of Transportation (Non-Medical):   Physical Activity:    Days of Exercise per Week:    Minutes of Exercise per Session:   Stress:    Feeling of Stress :   Social Connections:    Frequency of Communication with Friends and Family:    Frequency of Social Gatherings with Friends and  Family:    Attends Religious Services:    Active Member of Clubs or Organizations:    Attends Banker Meetings:    Marital Status:   Intimate Partner Violence:    Fear of Current or Ex-Partner:    Emotionally Abused:    Physically Abused:    Sexually Abused:     Past/failed meds:   Allergies: No Known Allergies    Immunizations: Immunization History  Administered Date(s) Administered   Influenza Split 11/05/2017    Diagnostics/Screenings:  Physical Exam: BP 122/80    Pulse 80    Ht 5' 6.5" (1.689 m)    Wt 207 lb 12.8 oz (94.3 kg)    BMI 33.04 kg/m   General: well developed, well nourished young man, seated in exam room, in no evident distress; sandy brown hair, hazel eyes, left handed Head: normocephalic and atraumatic. Oropharynx benign. No dysmorphic features. Neck: supple Cardiovascular: regular rate and rhythm, no murmurs. Respiratory: Clear to auscultation bilaterally Abdomen: Bowel sounds present all four quadrants, abdomen soft, non-tender, non-distended. No hepatosplenomegaly or masses palpated.  Musculoskeletal: Hemiatrophy and contractures of the right wrist and elbow, as well as both knees. The 3rd finger of the right hand is flexed toward the palm. Severe spastic right hemiparesis. Skin: no rashes or neurocutaneous lesions  Neurologic Exam Mental Status: Awake and fully alert. Evidence of cognitive delay. Has dysarthria and speech is difficult to understand at times. He looks to his mother for help with answering questions. Cooperative and participates in examination Cranial Nerves: Fundoscopic exam - red reflex present.  Unable to fully visualize fundus.  Pupils equal briskly reactive to light.  Turns to localize faces and objects in the periphery. Turns to localize sounds in the periphery. Facial movements are symmetric. Motor: Spastic right hemiparesis with hemiatrophy. Contractures of the right elbow and wrist and both knees. He has  limited ability to move the fingers of the right hand.  Sensory: Withdrawal x 4 Coordination: Coordination is fairly good on the left, very clumsy on the right.  Gait and Station: Has diplegic gait. The left foot over-pronates when he walks. Reflexes: Diminished and symmetric on the left, 1+ and symmetric on the right. Toes neutral. No clonus  Impression: 1. Right spastic hemiparesis 2. Diplegic gait 3. Complex partial seizures with secondary generalization 4. Developmental language disorder 5. Dyscalculia 6. Attention deficit disorder, mixed type  Recommendations for plan of care: The patient's previous [facility] records were reviewed. Terin has neither had nor required imaging or lab studies since the last visit. He is a 29 year old man with history of right spastic hemiparesis, diplegic gait, complex partial seizures with secondary generalization, developmental language disorder, dyscalculia and attention deficit disorder. He is taking and tolerating Depakote and has remained seizure free since 01/17/1999. Christerpher wants to participate in upcoming [employer] Olympics and I will complete the form for him to do so. I will otherwise see Derrien back in follow up in 1 year or sooner if needed.  Cecilio and his mother agreed with the plans made today.  The medication list was reviewed and reconciled. No changes were made in the prescribed medications today. A complete medication list was provided to the patient.  Allergies as of 08/13/2019   No Known Allergies     Medication List       Accurate as of August 13, 2019 10:04 AM. If you have any questions, ask your nurse or doctor.        acetaminophen 325 MG tablet Commonly known as: TYLENOL Take 650 mg by mouth every 6 (six) hours as needed for mild pain.   budesonide 180 MCG/ACT inhaler Commonly known as: PULMICORT Inhale 2 puffs into the lungs 2 (two) times daily.   Depakote 250 MG DR tablet Generic drug: divalproex TAKE 1 TABLET BY MOUTH 4  TIMES DAILY   montelukast 10 MG tablet Commonly known as: SINGULAIR TAKE 1 TABLET BY MOUTH AT BEDTIME FOR ASTHMA   nystatin 100000 UNIT/ML suspension Commonly known as: MYCOSTATIN SWISH OR GARGLE AND SPIT 10 CC 3-4 TIMES A DAY AS NEEDED   omeprazole 40 MG capsule Commonly known as: PRILOSEC Take 40 mg by mouth daily.   ProAir HFA 108 (90 Base) MCG/ACT inhaler Generic drug: albuterol TAKE 2 PUFFS EVERY 4 TO 6 HOURS AS NEEDED FOR COUGH/WHEEZE       Total time spent with the patient was 20 minutes, of which 50% or more was spent in counseling and coordination of care.  Elveria Rising NP-C [facility] Child [facility] Ph. [phone] Fax [phone]

## 2019-08-17 ENCOUNTER — Encounter (INDEPENDENT_AMBULATORY_CARE_PROVIDER_SITE_OTHER): Payer: Self-pay | Admitting: Family

## 2019-08-17 NOTE — Patient Instructions (Signed)
Thank you for coming in today.   Instructions for you until your next appointment are as follows: 1. Continue to take the Depakote as prescribed 2. Let me know if you have any seizures 3. I will complete the [employer] form and mail it to you 4. Please sign up for MyChart if you have not done so 5. Please plan to return for follow up in one year or sooner if needed.

## 2019-08-20 ENCOUNTER — Other Ambulatory Visit (INDEPENDENT_AMBULATORY_CARE_PROVIDER_SITE_OTHER): Payer: Self-pay | Admitting: Family

## 2019-08-20 DIAGNOSIS — G40209 Localization-related (focal) (partial) symptomatic epilepsy and epileptic syndromes with complex partial seizures, not intractable, without status epilepticus: Secondary | ICD-10-CM

## 2019-08-20 DIAGNOSIS — G40309 Generalized idiopathic epilepsy and epileptic syndromes, not intractable, without status epilepticus: Secondary | ICD-10-CM

## 2019-08-20 NOTE — Telephone Encounter (Signed)
Please send to the pharmacy °

## 2019-11-19 ENCOUNTER — Other Ambulatory Visit: Payer: Self-pay | Admitting: Internal Medicine

## 2019-11-20 ENCOUNTER — Telehealth: Payer: Self-pay | Admitting: Internal Medicine

## 2019-11-20 ENCOUNTER — Other Ambulatory Visit: Payer: Self-pay | Admitting: *Deleted

## 2019-11-20 MED ORDER — PROAIR HFA 108 (90 BASE) MCG/ACT IN AERS: INHALATION_SPRAY | RESPIRATORY_TRACT | 3 refills | Status: DC

## 2019-11-20 NOTE — Telephone Encounter (Signed)
Received fax from [facility] asking for covered alternative for Pulmicort Flexhaler 180   Covered alternatives are: spiriva respimat  flovent hfa or diskus  dulera  symbicort  arnuity asmanex  asmanex HFA   Please advise and when we call the pt will schedule him for ov since he is overdue, thanks

## 2019-11-21 ENCOUNTER — Telehealth: Payer: Self-pay | Admitting: Internal Medicine

## 2019-11-21 MED ORDER — ASMANEX (30 METERED DOSES) 110 MCG/INH IN AEPB: 2.0000 | INHALATION_SPRAY | Freq: Two times a day (BID) | RESPIRATORY_TRACT | 5 refills | Status: DC

## 2019-11-21 MED ORDER — PROAIR HFA 108 (90 BASE) MCG/ACT IN AERS: INHALATION_SPRAY | RESPIRATORY_TRACT | 3 refills | Status: AC

## 2019-11-21 NOTE — Telephone Encounter (Signed)
PA request received from [facility]  Drug requested: Asmanex CMM Key: [id number] Tried/failed: on file Covered alternatives: spiriva respimat, flovent HFA, flovent diskus PA request has been sent to plan, and a determination is expected within 3 days.   Routing to triage for follow-up.

## 2019-11-21 NOTE — Telephone Encounter (Signed)
amanex twisthaler 2 puff bid

## 2019-11-21 NOTE — Telephone Encounter (Signed)
Called and spoke with pt's father Loraine Leriche letting him know about the fax received by pharmacy and stated to him that we were switching pt from pulmicort to asmanex. Mark verbalized understanding. I have sent Rx for asmanex to pharmacy for pt and removed pulmicort off of med list. Also have refilled pt's proair inhaler. Also, pt has been scheduled for a f/u with TP 11/1. Nothing further needed.

## 2019-11-24 ENCOUNTER — Encounter: Payer: Self-pay | Admitting: Adult Health

## 2019-11-24 ENCOUNTER — Other Ambulatory Visit: Payer: Self-pay

## 2019-11-24 ENCOUNTER — Ambulatory Visit: Payer: Medicaid Other | Admitting: Adult Health

## 2019-11-24 ENCOUNTER — Ambulatory Visit (INDEPENDENT_AMBULATORY_CARE_PROVIDER_SITE_OTHER): Payer: Medicaid Other

## 2019-11-24 VITALS — BP 122/78 | HR 63 | Temp 97.7°F | Ht 68.0 in | Wt 210.0 lb

## 2019-11-24 DIAGNOSIS — J209 Acute bronchitis, unspecified: Secondary | ICD-10-CM | POA: Diagnosis not present

## 2019-11-24 DIAGNOSIS — R059 Cough, unspecified: Secondary | ICD-10-CM

## 2019-11-24 MED ORDER — CEFDINIR 300 MG PO CAPS: 300.0000 mg | ORAL_CAPSULE | Freq: Two times a day (BID) | ORAL | 0 refills | Status: DC

## 2019-11-24 MED ORDER — FLOVENT HFA 44 MCG/ACT IN AERO: 1.0000 | INHALATION_SPRAY | Freq: Two times a day (BID) | RESPIRATORY_TRACT | 5 refills | Status: AC

## 2019-11-24 MED ORDER — PREDNISONE 20 MG PO TABS: 20.0000 mg | ORAL_TABLET | Freq: Every day | ORAL | 0 refills | Status: DC

## 2019-11-24 NOTE — Assessment & Plan Note (Signed)
Acute asthmatic bronchitis-chest x-ray shows no evidence of pneumonia. Continue with pulmonary hygiene.  Control for triggers.  Add in Zyrtec as needed.  We will continue on Flonase.  Add in saline nasal rinses. Will change Pulmicort to Flovent due to insurance formulary changes. Treat with empiric antibiotics and a short course of prednisone.   Plan  . Patient Instructions  Begin Omnicef 300mg  Twice daily  For 7 days , take with food.  Prednisone 20mg  daily for 5 days.  Mucinex DM Twice daily  As needed  Cough/congestion .  Change Pulmicort to Flovent 1 puff Twice daily , rinse after use.  Begin Zyrtec 10mg  At bedtime  For 1 week then As needed   Saline nasal rinses As needed   Flonase 1 puff daily As needed   Fluids and rest  Continue on Singulair daily  Albuterol Inhaler As needed   Follow up with Dr. in 3 months and As needed   Please contact office for sooner follow up if symptoms do not improve or worsen or seek emergency care

## 2019-11-24 NOTE — Telephone Encounter (Signed)
Pt had an OV today 11/1 with TP and pt was switched to Flovent.

## 2019-11-24 NOTE — Progress Notes (Signed)
@Patient  ID: , male    DOB: 02/24/1990, 29 y.o.   MRN: [id number]  Chief Complaint  Patient presents with  . Follow-up    Cough     Referring provider: [id number], MD  HPI: 29 year old male never smoker followed for chronic cough Medical history significant for cerebral palsy, chronic dysphagia, seizure disorder  TEST/EVENTS :  2019 exhaled nitric oxide testing 29  11/24/2019 Follow up: Chronic Cough  Patient presents for an acute office visit.  Patient is accompanied by his mother.  Complains over the last 4 weeks he has had nasal congestion postnasal drainage cough with productive thick mucus and some mild wheezing.  Patient is used multiple over-the-counter cold products including Mucinex, Delsym, antihistamines.  Symptoms seem to get a little bit better then will worsen over the last week symptoms have really picked up and now he has a worsening cough and congestion.  Patient denies any fever, loss of taste or smell.  Covid vaccines are up-to-date x2.  He has not received his flu shot.  Does not have any history of known COVID-19.  He has not been tested for COVID-19..  Chest x-ray today shows no acute process.  Patient is on Pulmicort twice daily and Singulair daily.  Has had increased ProAir use.  Patient's insurance is no longer utilizing Pulmicort.  Has had several phone messages regarding approval from insurance.  Recent message indicated that he was going to be changed to Asmanex however this is also not covered.  We did look at his formulary and will switch to Flovent.  No Known Allergies  Immunization History  Administered Date(s) Administered  . Influenza Split 11/05/2017    Past Medical History:  Diagnosis Date  . Cerebral palsy (HCC)   . Congenital hemiparesis (HCC)   . Developmental language disorder   . Seizures (HCC)     Tobacco History: Social History   Tobacco Use  Smoking Status Never Smoker  Smokeless Tobacco Never Used    Counseling given: Not Answered   Outpatient Medications Prior to Visit  Medication Sig Dispense Refill  . acetaminophen (TYLENOL) 325 MG tablet Take 650 mg by mouth every 6 (six) hours as needed for mild pain.     11/07/2017 DEPAKOTE 250 MG DR tablet TAKE 1 TABLET BY MOUTH 4 TIMES DAILY. 120 tablet 5  . montelukast (SINGULAIR) 10 MG tablet TAKE 1 TABLET BY MOUTH AT BEDTIME FOR ASTHMA  0  . nystatin (MYCOSTATIN) 100000 UNIT/ML suspension SWISH OR GARGLE AND SPIT 10 CC 3-4 TIMES A DAY AS NEEDED    . omeprazole (PRILOSEC) 40 MG capsule Take 40 mg by mouth daily.    Marland Kitchen PROAIR HFA 108 (90 Base) MCG/ACT inhaler TAKE 2 PUFFS EVERY 4 TO 6 HOURS AS NEEDED FOR COUGH/WHEEZE 18 g 3  . Mometasone Furoate (ASMANEX, 30 METERED DOSES,) 110 MCG/INH AEPB Inhale 2 puffs into the lungs in the morning and at bedtime. 1 each 5   No facility-administered medications prior to visit.     Review of Systems:   Constitutional:   No  weight loss, night sweats,  Fevers, chills,  +fatigue, or  lassitude.  HEENT:   No headaches,  Difficulty swallowing,  Tooth/dental problems, or  Sore throat,                No sneezing, itching, ear ache,  +nasal congestion, post nasal drip,   CV:  No chest pain,  Orthopnea, PND, swelling in lower extremities, anasarca, dizziness, palpitations, syncope.  GI  No heartburn, indigestion, abdominal pain, nausea, vomiting, diarrhea, change in bowel habits, loss of appetite, bloody stools.   Resp:  .  No chest wall deformity  Skin: no rash or lesions.  GU: no dysuria, change in color of urine, no urgency or frequency.  No flank pain, no hematuria   MS:  No joint pain or swelling.  No decreased range of motion.  No back pain.    Physical Exam  BP 122/78   Pulse 63   Temp 97.7 F (36.5 C) (Oral)   Ht 5\' 8"  (1.727 m)   Wt 210 lb (95.3 kg)   SpO2 96%   BMI 31.93 kg/m   GEN: A/Ox3; pleasant , NAD, well nourished    HEENT:  /AT,   NOSE-clear, THROAT-clear, no lesions, no  postnasal drip or exudate noted.   NECK:  Supple w/ fair ROM; no JVD; normal carotid impulses w/o bruits; no thyromegaly or nodules palpated; no lymphadenopathy.    RESP  Clear  P & A; w/o, wheezes/ rales/ or rhonchi. no accessory muscle use, no dullness to percussion  CARD:  RRR, no m/r/g, no peripheral edema, pulses intact, no cyanosis or clubbing.  GI:   Soft & nt; nml bowel sounds; no organomegaly or masses detected.   Musco: Warm bil, no deformities or joint swelling noted.   Neuro: alert, no focal deficits noted.    Skin: Warm, no lesions or rashes    Lab Results:   BMET  BNP No results found for: BNP  ProBNP No results found for: PROBNP  Imaging: DG Chest 2 View  Result Date: 11/24/2019 CLINICAL DATA:  Cough and congestion EXAM: CHEST - 2 VIEW COMPARISON:  01/26/2015 FINDINGS: Normal heart size and mediastinal contours. No acute infiltrate or edema. No effusion or pneumothorax. No acute osseous findings. IMPRESSION: Negative chest. Electronically Signed   By: 03/26/2015 M.D.   On: 11/24/2019 09:40      No flowsheet data found.  No results found for: NITRICOXIDE      Assessment & Plan:   Acute bronchitis Acute asthmatic bronchitis-chest x-ray shows no evidence of pneumonia. Continue with pulmonary hygiene.  Control for triggers.  Add in Zyrtec as needed.  We will continue on Flonase.  Add in saline nasal rinses. Will change Pulmicort to Flovent due to insurance formulary changes. Treat with empiric antibiotics and a short course of prednisone.   Plan  . Patient Instructions  Begin Omnicef 300mg  Twice daily  For 7 days , take with food.  Prednisone 20mg  daily for 5 days.  Mucinex DM Twice daily  As needed  Cough/congestion .  Change Pulmicort to Flovent 1 puff Twice daily , rinse after use.  Begin Zyrtec 10mg  At bedtime  For 1 week then As needed   Saline nasal rinses As needed   Flonase 1 puff daily As needed   Fluids and rest  Continue on  Singulair daily  Albuterol Inhaler As needed   Follow up with Dr. 13/01/2019 in 3 months and As needed   Please contact office for sooner follow up if symptoms do not improve or worsen or seek emergency care           , NP 11/24/2019

## 2019-11-24 NOTE — Patient Instructions (Addendum)
Begin Omnicef 300mg  Twice daily  For 7 days , take with food.  Prednisone 20mg  daily for 5 days.  Mucinex DM Twice daily  As needed  Cough/congestion .  Change Pulmicort to Flovent 1 puff Twice daily , rinse after use.  Begin Zyrtec 10mg  At bedtime  For 1 week then As needed   Saline nasal rinses As needed   Flonase 1 puff daily As needed   Fluids and rest  Continue on Singulair daily  Albuterol Inhaler As needed   Follow up with Dr. in 3 months and As needed   Please contact office for sooner follow up if symptoms do not improve or worsen or seek emergency care

## 2020-02-17 ENCOUNTER — Other Ambulatory Visit: Payer: Self-pay | Admitting: Pediatrics

## 2020-02-17 DIAGNOSIS — G40209 Localization-related (focal) (partial) symptomatic epilepsy and epileptic syndromes with complex partial seizures, not intractable, without status epilepticus: Secondary | ICD-10-CM

## 2020-02-17 DIAGNOSIS — G40309 Generalized idiopathic epilepsy and epileptic syndromes, not intractable, without status epilepticus: Secondary | ICD-10-CM

## 2020-02-17 NOTE — Telephone Encounter (Signed)
Please send to the pharmacy °

## 2020-03-02 ENCOUNTER — Ambulatory Visit
Admission: RE | Admit: 2020-03-02 | Discharge: 2020-03-02 | Disposition: A | Discharge status: Home / Self Care | Payer: Medicaid Other | Source: Ambulatory Visit | Attending: Internal Medicine | Admitting: Internal Medicine

## 2020-03-02 ENCOUNTER — Other Ambulatory Visit: Payer: Self-pay | Admitting: Internal Medicine

## 2020-03-02 DIAGNOSIS — R310 Gross hematuria: Secondary | ICD-10-CM

## 2020-03-02 DIAGNOSIS — R109 Unspecified abdominal pain: Secondary | ICD-10-CM

## 2020-05-24 ENCOUNTER — Other Ambulatory Visit: Payer: Self-pay

## 2020-05-24 DIAGNOSIS — H9201 Otalgia, right ear: Secondary | ICD-10-CM | POA: Diagnosis present

## 2020-05-24 DIAGNOSIS — H7291 Unspecified perforation of tympanic membrane, right ear: Secondary | ICD-10-CM | POA: Insufficient documentation

## 2020-05-25 ENCOUNTER — Encounter (HOSPITAL_COMMUNITY): Payer: Self-pay

## 2020-05-25 ENCOUNTER — Emergency Department (HOSPITAL_COMMUNITY)
Admission: EM | Admit: 2020-05-25 | Discharge: 2020-05-25 | Disposition: A | Discharge status: Home / Self Care | Payer: Medicaid Other | Attending: Emergency Medicine | Admitting: Emergency Medicine

## 2020-05-25 DIAGNOSIS — H7291 Unspecified perforation of tympanic membrane, right ear: Secondary | ICD-10-CM

## 2020-05-25 NOTE — Discharge Instructions (Signed)
Keep ear canal covered with cotton balls or similar while showering bathing.  Avoid fully suberging head (swimming) until ear is fully healed Follow-up with ENT-- call in the morning to get this set up. Return to the ED for new or worsening symptoms.

## 2020-05-25 NOTE — ED Triage Notes (Signed)
Pt presents with dad, states they were at an event that was very loud and patient may have scratched inside of R ear. Some dried blood noted

## 2020-05-25 NOTE — ED Provider Notes (Signed)
[name] Provider Note   CSN: [id number] Arrival date & time: 05/24/20  2310     History Chief Complaint  Patient presents with  . Otalgia    Carl Bryant is a 30 y.o. male.  The history is provided by the patient and medical records.  Otalgia   30 year old male with history of cerebral palsy and developmental language disorder, presenting to the ED with father for acute onset right ear pain.  They were at wrestling event at the [facility]  Very loud music and fireworks were going off inside when all of a sudden patient grabbed his right ear and yelled out in pain.  Dad took him to get evaluated by EMS personnel onsite and noticed that he had some blood in his ear canal.  They were sent here for further evaluation.  Patient states he can still hear out of his right ear but there is a buzzing sound present.  Past Medical History:  Diagnosis Date  . Cerebral palsy (HCC)   . Congenital hemiparesis (HCC)   . Developmental language disorder   . Seizures [facility])     Patient Active Problem List   Diagnosis Date Noted  . Acute bronchitis 11/24/2019  . Cough 02/22/2018  . CAP (community acquired pneumonia) 01/25/2015  . Sepsis (HCC) 01/25/2015  . AKI (acute kidney injury) (HCC) 01/25/2015  . Nausea & vomiting 01/25/2015  . 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 Partial epilepsy with impairment of consciousness (HCC) 05/30/2012  . Congenital hemiparesis (HCC) 05/30/2012  . Diplegia (HCC) 05/30/2012  . Developmental language disorder 05/30/2012    Past Surgical History:  Procedure Laterality Date  . CIRCUMCISION  1990  . FOOT CAPSULE RELEASE W/ PERCUTANEOUS HEEL CORD LENGTHENING, TIBIAL TENDON TRANSFER  09/2011   right foot  . TENDON RELEASE  09/2011   right wrist, elbow       Family History  Adopted: Yes    Social History   Tobacco Use  . Smoking status: Never Smoker  .  Smokeless tobacco: Never Used  Substance Use Topics  . Alcohol use: No    Alcohol/week: 0.0 standard drinks  . Drug use: No    Home Medications Prior to Admission medications   Medication Sig Start Date End Date Taking? Authorizing Provider  acetaminophen (TYLENOL) 325 MG tablet Take 650 mg by mouth every 6 (six) hours as needed for mild pain.     [provider]  cefdinir (OMNICEF) 300 MG capsule Take 1 capsule (300 mg total) by mouth 2 (two) times daily. 11/24/19   Parrett, Tammy S, NP  DEPAKOTE 250 MG DR tablet TAKE 1 TABLET BY MOUTH 4 TIMES A DAY 02/17/20   Elveria Rising, NP  fluticasone (FLOVENT HFA) 44 MCG/ACT inhaler Inhale 1 puff into the lungs in the morning and at bedtime. 11/24/19   Parrett, Virgel Bouquet, NP  montelukast (SINGULAIR) 10 MG tablet TAKE 1 TABLET BY MOUTH AT BEDTIME FOR ASTHMA 08/17/17   [provider]  nystatin (MYCOSTATIN) 100000 UNIT/ML suspension SWISH OR GARGLE AND SPIT 10 CC 3-4 TIMES A DAY AS NEEDED 02/15/18   [provider]  omeprazole (PRILOSEC) 40 MG capsule Take 40 mg by mouth daily.    [provider]  predniSONE (DELTASONE) 20 MG tablet Take 1 tablet (20 mg total) by mouth daily with breakfast. 11/24/19   Parrett, Virgel Bouquet, NP  PROAIR HFA 108 (90 Base) MCG/ACT inhaler TAKE 2 PUFFS EVERY 4 TO  6 HOURS AS NEEDED FOR COUGH/WHEEZE 11/21/19   Kalman Shan, MD    Allergies    Patient has no known allergies.  Review of Systems   Review of Systems  HENT: Positive for ear pain.   All other systems reviewed and are negative.   Physical Exam Updated Vital Signs BP (!) 145/90 (BP Location: Left Arm)   Pulse (!) 105   Temp 97.9 F (36.6 C) (Oral)   Resp 16   Ht 5\' 8"  (1.727 m)   Wt 104.3 kg   SpO2 97%   BMI 34.97 kg/m   Physical Exam Vitals and nursing note reviewed.  Constitutional:      Appearance: He is well-developed.  HENT:     Head: Normocephalic and atraumatic.     Ears:     Comments: Appears to have  small right TM perforation, small amount of blood in the EAC Eyes:     Conjunctiva/sclera: Conjunctivae normal.     Pupils: Pupils are equal, round, and reactive to light.  Cardiovascular:     Rate and Rhythm: Normal rate and regular rhythm.     Heart sounds: Normal heart sounds.  Pulmonary:     Effort: Pulmonary effort is normal.     Breath sounds: Normal breath sounds.  Abdominal:     General: Bowel sounds are normal.     Palpations: Abdomen is soft.  Musculoskeletal:        General: Normal range of motion.     Cervical back: Normal range of motion.  Skin:    General: Skin is warm and dry.  Neurological:     Mental Status: He is alert and oriented to person, place, and time.     ED Results / Procedures / Treatments   Labs (all labs ordered are listed, but only abnormal results are displayed) Labs Reviewed - No data to display  EKG None  Radiology No results found.  Procedures Procedures   Medications Ordered in ED Medications - No data to display  ED Course  I have reviewed the triage vital signs and the nursing notes.  Pertinent labs & imaging results that were available during my care of the patient were reviewed by me and considered in my medical decision making (see chart for details).    MDM Rules/Calculators/A&P  30 year old male presenting to the ED with sudden onset of right ear pain and bleeding while at a wrestling event.  There was a lot of loud music and fireworks going off inside.  States now he has a buzzing sensation in his right ear.  On exam he does have a small amount of blood in the EAC and appears to have a small TM perforation.  He can still hear fairly well out of his ear when I speak with him.  Discussed supportive care measures at home, preventing water from entering the canal while showering.  Avoid swimming or fully submerging the head.  Given ENT follow-up.  Father at bedside understands instructions and follow-up care.  Return here for new  concerns.  Final Clinical Impression(s) / ED Diagnoses Final diagnoses:  Perforation of right tympanic membrane    Rx / DC Orders ED Discharge Orders    None       38, PA-C 05/25/20 0120    07/25/20, MD 05/25/20 [phone]

## 2020-05-30 ENCOUNTER — Encounter (INDEPENDENT_AMBULATORY_CARE_PROVIDER_SITE_OTHER): Payer: Self-pay

## 2020-08-15 ENCOUNTER — Other Ambulatory Visit (INDEPENDENT_AMBULATORY_CARE_PROVIDER_SITE_OTHER): Payer: Self-pay | Admitting: Family

## 2020-08-15 DIAGNOSIS — G40309 Generalized idiopathic epilepsy and epileptic syndromes, not intractable, without status epilepticus: Secondary | ICD-10-CM

## 2020-08-15 DIAGNOSIS — G40209 Localization-related (focal) (partial) symptomatic epilepsy and epileptic syndromes with complex partial seizures, not intractable, without status epilepticus: Secondary | ICD-10-CM

## 2020-08-23 ENCOUNTER — Telehealth (INDEPENDENT_AMBULATORY_CARE_PROVIDER_SITE_OTHER): Payer: Self-pay | Admitting: Family

## 2020-08-23 DIAGNOSIS — G40209 Localization-related (focal) (partial) symptomatic epilepsy and epileptic syndromes with complex partial seizures, not intractable, without status epilepticus: Secondary | ICD-10-CM

## 2020-08-23 DIAGNOSIS — G40309 Generalized idiopathic epilepsy and epileptic syndromes, not intractable, without status epilepticus: Secondary | ICD-10-CM

## 2020-08-23 NOTE — Telephone Encounter (Signed)
  Who's calling (name and relationship to patient) :[facility]   Best contact [phone]  Provider they BSW:[facility]   Reason for call:Needs a fax back with the doctors signature and if you can write that it is medically neccessary for him to have this medication. This change is needed for insurance purpose and requirements.  Please [facility] to [facility] @ [phone]      PRESCRIPTION REFILL ONLY  Name of prescription:DEPAKOTE DR 250mg    [facility]

## 2020-08-24 MED ORDER — DEPAKOTE 250 MG PO TBEC: 250.0000 mg | DELAYED_RELEASE_TABLET | Freq: Four times a day (QID) | ORAL | 0 refills | Status: DC

## 2020-08-24 NOTE — Telephone Encounter (Signed)
Done. Patient needs an appointment. Message sent to scheduler. TG

## 2020-09-08 ENCOUNTER — Telehealth (INDEPENDENT_AMBULATORY_CARE_PROVIDER_SITE_OTHER): Payer: Medicaid Other | Admitting: Pediatrics

## 2020-09-14 ENCOUNTER — Telehealth (INDEPENDENT_AMBULATORY_CARE_PROVIDER_SITE_OTHER): Payer: Self-pay | Admitting: Pediatrics

## 2020-09-14 ENCOUNTER — Encounter: Payer: Self-pay | Admitting: Neurology

## 2020-09-14 ENCOUNTER — Other Ambulatory Visit: Payer: Self-pay

## 2020-09-14 ENCOUNTER — Ambulatory Visit (INDEPENDENT_AMBULATORY_CARE_PROVIDER_SITE_OTHER): Payer: Medicaid Other | Admitting: Pediatrics

## 2020-09-14 ENCOUNTER — Encounter (INDEPENDENT_AMBULATORY_CARE_PROVIDER_SITE_OTHER): Payer: Self-pay | Admitting: Pediatrics

## 2020-09-14 VITALS — BP 118/80 | HR 64 | Ht 66.5 in | Wt 212.6 lb

## 2020-09-14 DIAGNOSIS — G808 Other cerebral palsy: Secondary | ICD-10-CM

## 2020-09-14 DIAGNOSIS — E6609 Other obesity due to excess calories: Secondary | ICD-10-CM

## 2020-09-14 DIAGNOSIS — G40209 Localization-related (focal) (partial) symptomatic epilepsy and epileptic syndromes with complex partial seizures, not intractable, without status epilepticus: Secondary | ICD-10-CM | POA: Diagnosis not present

## 2020-09-14 DIAGNOSIS — Z6833 Body mass index (BMI) 33.0-33.9, adult: Secondary | ICD-10-CM

## 2020-09-14 DIAGNOSIS — E669 Obesity, unspecified: Secondary | ICD-10-CM | POA: Insufficient documentation

## 2020-09-14 DIAGNOSIS — G40309 Generalized idiopathic epilepsy and epileptic syndromes, not intractable, without status epilepticus: Secondary | ICD-10-CM | POA: Diagnosis not present

## 2020-09-14 MED ORDER — DEPAKOTE 250 MG PO TBEC: DELAYED_RELEASE_TABLET | ORAL | 5 refills | Status: DC

## 2020-09-14 NOTE — Progress Notes (Signed)
Patient: Carl Bryant MRN: [id number] Sex: male DOB: 09/09/1990  Provider: Ellison Carwin, MD Location of Care: [facility]  Note type: Routine return visit  History of Present Illness: Referral Source: Geoffry Paradise, MD History from: father, patient, and [facility] chart Chief Complaint: Follow up  Carl Bryant is a 30 y.o. male who was evaluated September 14, 2020 for the first time since August 13, 2019.  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  He has a developmental language disorder with expressive problems greater than receptive and also dyscalculia.  He likely has attention deficit disorder but since he is not in school he is not taking their stimulant medication.  He takes and tolerates Depakote, trade drug and has been seizure-free since January 17, 1999.  He is treated by Dr. Remonia Richter at [facility] for his contractures and receives Botox in various muscles from time to time.  A number of years ago he had soft tissue release in his right brachial fossa, fusion of his right wrist to prevent hyperflexion, and reconstruction of his right foot to improve his arch and straighten it.  This summer he spent 6 weeks at [employer] (day camp) 1 week overnight it can pugs.  3 times a week he engages in adaptive inclusive physical activities through [facility] and recreation sometimes is in competition other times its physical exercise.  His only Special Olympic activities skiing.  Last year he skied twice at [facility].  This was a combination of COVID and lack of snow.  He has been seen by Elveria Rising for a number years.  I will need to discuss with her his long-term disposition.  He can be seen in the office of Dr. Patrcia Dolly or Inetta Fermo can continue to  follow him.  His general health is good except for his obesity.  He has not contracted COVID.  He has been vaccinated.  He had a COVID-like illness in January February 23 for at least a few weeks with a chronic cough.  Review of Systems: A complete review of systems was remarkable for patient is here to be seen for a follow up. Father reports that the patient has been doing well. He states that he has not had any seizures.He reports no concerns at this time, all other systems reviewed and negative.  Past Medical History Diagnosis Date   Cerebral palsy (HCC)    Congenital hemiparesis (HCC)    Developmental language disorder    Seizures (HCC)    Hospitalizations: No., Head Injury: No., Nervous System Infections: No., Immunizations up to date: Yes.    Copied from previous record: right hemiparesis, diplegic gait, complex partial seizures with secondary generalization, developmental language disorder, dyscalculia  Behavior History none  Surgical History Procedure Laterality Date   CIRCUMCISION  1990   FOOT CAPSULE RELEASE W/ PERCUTANEOUS HEEL CORD LENGTHENING, TIBIAL TENDON TRANSFER  09/2011   right foot   TENDON RELEASE  09/2011   right wrist, elbow   Family History family history is not on file. He was adopted. Family history is negative for migraines, seizures, intellectual disabilities, blindness, deafness, birth defects, chromosomal disorder, or autism.  Social History Socioeconomic History   Marital status: Single   Years of education: 13+   Highest education level: High school certificate  Occupational History  Not employed  Tobacco Use   Smoking status: Never   Smokeless tobacco: Never  Substance and Sexual Activity   Alcohol use: No    Alcohol/week: 0.0 standard drinks   Drug use: No   Sexual activity: Never  Other Topics Concern   Not on file  Social History Narrative   Carl Bryant is a 30 year old male. He does not attend school. He lives with both parents and he  has one sister, Carl Bryant, who is 53 yo. He enjoys baseball and video games   No Known Allergies  Physical Exam BP 118/80   Pulse 64   Ht 5' 6.5" (1.689 m)   Wt 212 lb 9.6 oz (96.4 kg)   BMI 33.80 kg/m   General: alert, well developed, obese, in no acute distress, brown hair, hazel eyes, left handed Head: normocephalic, no dysmorphic features Ears, Nose and Throat: Otoscopic: tympanic membranes normal; pharynx: oropharynx is pink without exudates or tonsillar hypertrophy Neck: supple, full range of motion, no cranial or cervical bruits Respiratory: auscultation clear Cardiovascular: no murmurs, pulses are normal Musculoskeletal: Left hemiatrophy with spastic contractures on the right at the shoulder, elbow, hip, and knee, tight heel cord, fused wrist, third finger is flexed toward the palm Skin: no rashes or neurocutaneous lesions  Neurologic Exam  Mental Status: alert; oriented to person; knowledge is below normal for age; language is limited in his expression however he is able to follow most commands Cranial Nerves: visual fields are full to double simultaneous stimuli; extraocular movements are full and conjugate; pupils are round, reactive to light, positive red reflex; symmetric facial strength; midline tongue and uvula; air conduction is greater than bone conduction bilaterally Motor: spastic right hemiparesis with right-sided weakness and severe incoordination of the right hand with weakness and flexion contractures Sensory: intact responses to cold, vibration, proprioception and stereognosis on the left, intact to cold on the right Coordination: good finger-to-nose, rapid repetitive alternating movements and finger apposition on the left, poor on the right Gait and Station: spastic right hemiparetic gait with circumduction of the right leg, adduction and flexion in his right arm Reflexes:, right reflex predominance, right extensor plantar response, left flexor plantar  response  Assessment 1.  Congenital hemiparesis, G80.8. 2.  Generalized convulsive epilepsy, G40.309. 3.  Focal epilepsy with impairment of consciousness, G40.209. 4.  Obesity, E66.9.  Discussion Cord is medically neurologically stable.  I am pleased with his seizure control.  Plan A prescription was issued for trade drug Depakote.  Greater than 50% of this 30-minute visit was spent in counseling coordination of care concerning his seizures, his spasticity, the ongoing care he receives from [facility] which should continue.  I will discuss with Inetta Fermo long-term transition of care.  If we refer him to an adult neurologist it would be Dr. Patrcia Dolly.  Inetta Fermo has decided that she wants to continue to follow Reuel Boom.  He does not need to be seen for a year.  He will need a prescription refill in 6 months. Medication List    Accurate as of September 14, 2020  3:02 PM. If you have any questions, ask your nurse or doctor.     acetaminophen 325 MG tablet Commonly known as: TYLENOL Take 650 mg by mouth every 6 (six) hours as needed for mild pain.   Depakote 250 MG DR tablet Generic drug: divalproex Take 1 tablet (250 mg total) by mouth 4 (four) times daily. What changed:  how much to take how to take this when to  take this additional instructions Changed by: Ellison Carwin, MD   Flovent [facility] 44 MCG/ACT inhaler Generic drug: fluticasone Inhale 1 puff into the lungs in the morning and at bedtime.   montelukast 10 MG tablet Commonly known as: SINGULAIR TAKE 1 TABLET BY MOUTH AT BEDTIME FOR ASTHMA   nystatin 100000 UNIT/ML suspension Commonly known as: MYCOSTATIN SWISH OR GARGLE AND SPIT 10 CC 3-4 TIMES A DAY AS NEEDED   omeprazole 40 MG capsule Commonly known as: PRILOSEC Take 40 mg by mouth daily as needed.   ProAir HFA 108 (90 Base) MCG/ACT inhaler Generic drug: albuterol TAKE 2 PUFFS EVERY 4 TO 6 HOURS AS NEEDED FOR COUGH/WHEEZE     The medication list was reviewed and  reconciled. All changes or newly prescribed medications were explained.  A complete medication list was provided to the patient/caregiver.  Deetta Perla MD

## 2020-09-14 NOTE — Patient Instructions (Signed)
It has been up pleasure and a privilege to care for Carl Bryant for most of his life.  You always believe that there was nothing that he could not do with effort.  I think this is allowed him to grow up to be fine young man that he has.  I will always keep you in my heart.  I will always remember the gifts of red crabs that look like [place] that you would bring to me from the beach trips so that I could test visual fields with children.  I wish you the best always.  We are going to refer you to Dr. Patrcia Dolly at [facility].  She is a specialist in epilepsy but has been able to take care of some of the other issues that my patients have.  Because you have a longstanding relationship with [facility], they will be able to take care of the issues related to his spastic right hemiparesis.

## 2020-09-14 NOTE — Telephone Encounter (Signed)
Note to family concerning long-term follow-up.  Carl Bryant has decided to continue to follow Carl Bryant.  I sent a MyChart note.

## 2020-09-15 NOTE — Telephone Encounter (Signed)
It is up to you.  Inetta Fermo would be happy to follow Hymie until she Raechel Ache which will likely be a few years from now.  Ultimately, Dr. Karel Jarvis will be the person that our practice refers [place] to.  You don't need both at this time.  Please let me know.

## 2020-10-15 ENCOUNTER — Ambulatory Visit: Payer: Medicaid Other | Admitting: Neurology

## 2020-10-15 ENCOUNTER — Encounter: Payer: Self-pay | Admitting: Neurology

## 2020-10-15 ENCOUNTER — Other Ambulatory Visit: Payer: Self-pay

## 2020-10-15 VITALS — BP 125/77 | HR 68 | Ht 68.0 in | Wt 211.4 lb

## 2020-10-15 DIAGNOSIS — Z79899 Other long term (current) drug therapy: Secondary | ICD-10-CM | POA: Diagnosis not present

## 2020-10-15 DIAGNOSIS — G40209 Localization-related (focal) (partial) symptomatic epilepsy and epileptic syndromes with complex partial seizures, not intractable, without status epilepticus: Secondary | ICD-10-CM

## 2020-10-15 NOTE — Progress Notes (Signed)
NEUROLOGY CONSULTATION NOTE  Carl Bryant Bryant MRN: [id number] DOB: [birth year]  Referring provider: Dr. Ellison Bryant Primary care provider: Dr. Geoffry Bryant  Reason for consult:  establish adult epilepsy care  Dear Dr Carl Bryant Bryant:  Thank you for your kind referral of Carl Bryant Bryant for consultation of the above symptoms. Although his history is well known to you, please allow me to reiterate it for the purpose of our medical record. The patient was accompanied to the clinic by his father who also provides collateral information. Records and images were personally reviewed where available.   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 His father is present to provide the history, records from his pediatric neurologist Dr. Sharene Bryant were reviewed. His father reports seizures started at 5-77 months of age when he had a febrile seizure. He then started having drop seizures every 3-6 months where he would have quick loss of tone then be right back. He had rare GTCs, and had a longer one at age 41, which was his last seizure (01/17/1999). He is on brand Depakote DR 250mg  four times a day without side effects. His father reports his PCP checks bloodwork regularly. When he was younger, there would be little times where he would stare and look around but would respond when called. His father has not noticed these as often now. He denies any headaches, dizziness, vision changes, focal numbness/tingling, bowel/bladder dysfunction. He has had surgery to correct contractures of right wrist, elbow, and foot at [facility], and continues to see them for Botox (last session was 1.5 years ago). He remains active, although he has not been able to go skiing recently because his right foot  is turning out more again. Sleep is good. He lives with his parents who administer medications.   Diagnostic Data: no prior EEG or brain imaging available for review  PAST MEDICAL HISTORY: Past Medical History:  Diagnosis Date   Cerebral palsy (HCC)    Congenital hemiparesis (HCC)    Developmental language disorder    Seizures (HCC)     PAST SURGICAL HISTORY: Past Surgical History:  Procedure Laterality Date   CIRCUMCISION  1990   FOOT CAPSULE RELEASE W/ PERCUTANEOUS HEEL CORD LENGTHENING, TIBIAL TENDON TRANSFER  09/2011   right foot   TENDON RELEASE  09/2011   right wrist, elbow    MEDICATIONS: Current Outpatient Medications on File Prior to Visit  Medication Sig Dispense Refill   acetaminophen (TYLENOL) 325 MG tablet Take 650 mg by mouth every 6 (six) hours as needed for mild pain.      DEPAKOTE 250 MG DR tablet Take 1 tablet (250 mg total) by mouth 4 (four) times daily. 120 tablet 5   fluticasone (FLOVENT HFA) 44 MCG/ACT inhaler Inhale 1 puff into the lungs in the morning and at bedtime. 1 each 5   montelukast (SINGULAIR) 10 MG tablet TAKE 1 TABLET BY MOUTH AT BEDTIME FOR ASTHMA  0   nystatin (MYCOSTATIN) 100000 UNIT/ML suspension SWISH OR GARGLE AND SPIT 10 CC 3-4 TIMES A DAY AS NEEDED     omeprazole (PRILOSEC) 40 MG capsule Take 40 mg by mouth daily as needed.     PROAIR HFA 108 (90 Base) MCG/ACT inhaler TAKE 2 PUFFS EVERY 4 TO 6 HOURS AS NEEDED FOR COUGH/WHEEZE 18 g 3   No  current facility-administered medications on file prior to visit.    ALLERGIES: No Known Allergies  FAMILY HISTORY: Family History  Adopted: Yes    SOCIAL HISTORY: Social History   Socioeconomic History   Marital status: Single    Spouse name: Not on file   Number of children: Not on file   Years of education: Not on file   Highest education level: Not on file  Occupational History   Not on file  Tobacco Use   Smoking status: Never   Smokeless tobacco: Never  Vaping Use   Vaping Use:  Never used  Substance and Sexual Activity   Alcohol use: No    Alcohol/week: 0.0 standard drinks   Drug use: No   Sexual activity: Never  Other Topics Concern   Not on file  Social History Narrative   Carl Bryant Bryant is a 30 year old male. He does not attend school. He lives with both parents and he has one sister, Carl Bryant Bryant, who is 50 yo. He enjoys baseball and video games   Social Determinants of Health   Financial Resource Strain: Not on file  Food Insecurity: Not on file  Transportation Needs: Not on file  Physical Activity: Not on file  Stress: Not on file  Social Connections: Not on file  Intimate Partner Violence: Not on file     PHYSICAL EXAM: Vitals:   10/15/20 1004  BP: 125/77  Pulse: 68  SpO2: 98%   General: No acute distress Head:  Normocephalic/atraumatic Skin/Extremities: No rash, no edema Neurological Exam: Mental status: alert and oriented to person, month. Knows his birthday but could not say birth year or current year. There is mild dysarthria. He has a developmental language disorder with expressive problems greater than receptive, he is able to name objects and has 3/3 delayed recall. Able to follow commands.  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  Motor: 5/5 on left UE and LE, spastic contracture at right elbow and wrist, 3+/5 right proximal UE, 0/5 wrist extension.  Sensation: intact to light touch, cold, pin, vibration and joint position sense.  No extinction to double simultaneous stimulation.  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 He has been seizure-free since 12/1998 on brand Depakote DR 250mg  four times a day. There was prior discussion about weaning off medication since he has been seizure-free for many years, however I agree with Dr. that his risk for recurrent seizures is high and continued seizure medication is recommended. We discussed how long-term use of Depakote may affect bone density, we will do a bone density scan, continue to monitor vitamin D levels with PCP. Dr. Sharene Bryant had sent refills last month for 6 months, they will call our office once close to due for next updated prescription. Follow-up in 1 year, they know to call for  any changes.  Thank you for allowing me to participate in the care of this patient. Please do not hesitate to call for any questions or concerns.   Patrcia Dolly, M.D.  CC: Dr. Sharene Bryant, Dr. Jacky Kindle

## 2020-10-15 NOTE — Patient Instructions (Addendum)
Good to meet you!  Continue Depakote DR 250mg  four times a day. Let me know once you are close to needing your next refills after Dr. prescription is done.  2. Schedule bone density scan  3. Discuss checking vitamin D level with PCP  4. Follow-up in 1 year, call for any changes   Seizure Precautions: 1. If medication has been prescribed for you to prevent seizures, take it exactly as directed.  Do not stop taking the medicine without talking to your doctor first, even if you have not had a seizure in a long time.   2. Avoid activities in which a seizure would cause danger to yourself or to others.  Don't operate dangerous machinery, swim alone, or climb in high or dangerous places, such as on ladders, roofs, or girders.  Do not drive unless your doctor says you may.  3. If you have any warning that you may have a seizure, lay down in a safe place where you can't hurt yourself.    4.  No driving for 6 months from last seizure, as per [facility].   Please refer to the following link on the Epilepsy Foundation of America's website for more information: http://www.epilepsyfoundation.org/answerplace/Social/driving/drivingu.cfm   5.  Maintain good sleep hygiene.  6.  Contact your doctor if you have any problems that may be related to the medicine you are taking.  7.  Call 911 and bring the patient back to the ED if:        A.  The seizure lasts longer than 5 minutes.       B.  The patient doesn't awaken shortly after the seizure  C.  The patient has new problems such as difficulty seeing, speaking or moving  D.  The patient was injured during the seizure  E.  The patient has a temperature over 102 F (39C)  F.  The patient vomited and now is having trouble breathing

## 2020-12-02 ENCOUNTER — Other Ambulatory Visit: Payer: Self-pay

## 2020-12-02 ENCOUNTER — Ambulatory Visit
Admission: RE | Admit: 2020-12-02 | Discharge: 2020-12-02 | Disposition: A | Discharge status: Home / Self Care | Payer: Medicaid Other | Source: Ambulatory Visit | Attending: Neurology | Admitting: Neurology

## 2020-12-02 DIAGNOSIS — Z79899 Other long term (current) drug therapy: Secondary | ICD-10-CM

## 2020-12-02 DIAGNOSIS — G40209 Localization-related (focal) (partial) symptomatic epilepsy and epileptic syndromes with complex partial seizures, not intractable, without status epilepticus: Secondary | ICD-10-CM

## 2020-12-06 ENCOUNTER — Telehealth: Payer: Self-pay

## 2020-12-06 NOTE — Telephone Encounter (Signed)
Pt called an informed that bone density scan was normal

## 2020-12-06 NOTE — Telephone Encounter (Signed)
-----   Message from Van Clines, MD sent at 12/06/2020  8:31 AM EST ----- Pls let patient/family know the bone density scan was normal, thanks

## 2021-02-19 ENCOUNTER — Other Ambulatory Visit: Payer: Self-pay | Admitting: Adult Health

## 2021-03-23 ENCOUNTER — Telehealth (INDEPENDENT_AMBULATORY_CARE_PROVIDER_SITE_OTHER): Payer: Self-pay | Admitting: Family

## 2021-03-23 DIAGNOSIS — G40209 Localization-related (focal) (partial) symptomatic epilepsy and epileptic syndromes with complex partial seizures, not intractable, without status epilepticus: Secondary | ICD-10-CM

## 2021-03-23 DIAGNOSIS — G40309 Generalized idiopathic epilepsy and epileptic syndromes, not intractable, without status epilepticus: Secondary | ICD-10-CM

## 2021-03-23 MED ORDER — DEPAKOTE 250 MG PO TBEC: DELAYED_RELEASE_TABLET | ORAL | 5 refills | Status: DC

## 2021-03-23 NOTE — Telephone Encounter (Signed)
?  Who's calling (name and relationship to patient) : ?Father  Loraine Leriche  ?Best contact number: ?[phone] ?Provider they PRF:FMBW  ? ?Reason for call:refill for Depakote  DR ? ? ? ? ?PRESCRIPTION REFILL ONLY ? ?Name of prescription:Depakote  DR ? ?Pharmacy:[facility]  on [address] ? ? ?

## 2021-03-23 NOTE — Telephone Encounter (Signed)
Faxed rx. TG ?

## 2021-07-25 ENCOUNTER — Telehealth (INDEPENDENT_AMBULATORY_CARE_PROVIDER_SITE_OTHER): Payer: Self-pay | Admitting: Family

## 2021-07-25 NOTE — Telephone Encounter (Signed)
Who's calling (name and relationship to patient) : Loraine Leriche; dad   Best contact number: [phone]  Provider they see: [place]. NP  Reason for call: Dad has called in stating that his son takes  Depakote, and the pharmacy stated that it is no longer being manufactured. He wants to know how they should proceed. He was only given 21 pills, due to what was left. Loraine Leriche also stated that Dr. Sharene Skeans wanted to keep Ilan on the name brand. Dad has requested a call back.   Call ID:      PRESCRIPTION REFILL ONLY  Name of prescription:  Pharmacy:

## 2021-07-27 ENCOUNTER — Encounter: Payer: Self-pay | Admitting: Neurology

## 2021-07-28 ENCOUNTER — Other Ambulatory Visit: Payer: Self-pay

## 2021-07-28 ENCOUNTER — Other Ambulatory Visit (HOSPITAL_COMMUNITY): Payer: Self-pay

## 2021-07-28 ENCOUNTER — Telehealth (HOSPITAL_COMMUNITY): Payer: Self-pay | Admitting: Pharmacy Technician

## 2021-07-28 MED ORDER — DEPAKOTE ER 500 MG PO TB24: ORAL_TABLET | ORAL | 3 refills | Status: DC

## 2021-07-28 NOTE — Telephone Encounter (Signed)
Patient Advocate Encounter  Prior Authorization for Depakote ER 500 mg tablets has been approved.    PA# [id number] Effective dates: 07/28/2021 through 07/28/2022      Roland Earl, CPhT Pharmacy Patient Advocate Specialist [facility] Patient Advocate Team Direct Number: [phone]  Fax: [phone]

## 2021-07-28 NOTE — Telephone Encounter (Signed)
Spoke with pt father Loraine Leriche informed him that PA was approved

## 2021-07-28 NOTE — Telephone Encounter (Signed)
Patient Advocate Encounter   Received notification that prior authorization for Depakote ER 500 mg tablets is required.   PA submitted on 07/28/2021 Confirmation #:[id number] W Status is pending       Roland Earl, CPhT Pharmacy Patient Advocate Specialist [facility] Patient Advocate Team Direct Number: [phone]  Fax: [phone]

## 2021-07-28 NOTE — Telephone Encounter (Signed)
-----   Message from Dayna Ramus, LPN sent at 01/27/9537 11:40 AM EDT ----- Regarding: RE: PA pediatric neurologist and Dr Sharen Counter confirmed that he had seizures on generic Depakote so he needs to be on brand. Please expedited because he only has 2 days left.  Thank you  ----- Message ----- From: Josepha Pigg, CPhT Sent: 07/28/2021  11:34 AM EDT To: Dayna Ramus, LPN; Rx Prior Auth Team Subject: RE: PA                                         The generic Depakote ER 500 does not require a PA.  Did not see were prescription was sent as Dispense as Written.  Pharmacy will just need to fill generic.   ----- Message ----- From: Dayna Ramus, LPN Sent: 06/30/2895  10:14 AM EDT To: Rx Prior Auth Team Subject: PA                                             Pt needs a PA on Depakote ER 500mg : Take 1 tablet twice a day   Thank you

## 2021-07-28 NOTE — Telephone Encounter (Signed)
Thank you for all your help!

## 2021-07-29 ENCOUNTER — Telehealth: Payer: Self-pay | Admitting: Neurology

## 2021-07-29 NOTE — Telephone Encounter (Signed)
[facility] re: Depokote.  He said the fax was sent into the wrong pharmacy but they sent it to him.  Now, he said clinical staff need to go to Documents for Safety and designate "brand name only" for Depakote.

## 2021-07-29 NOTE — Telephone Encounter (Signed)
I spoke with pt father to let him know that I had sent in the prescription via a fax again with doctor signature, I also advised that Pt that Dr Karel Jarvis stated that if it is still on back order we can change him to Depakote sprinkle,  pt father stated that he has been on it before.  [facility] he stated that we needed to go online an do Documents for Safety and designate "brand name only" for Depakote. I told him the script stated use name brand and that we have done a PA for the pt and it is good until next year, I told him I have no idea what a document for safety is and that in the years I have been here I have never done that for Medicaid patients. Rosanne Ashing stated that he will call Medicaid and tell them it was done.

## 2021-07-29 NOTE — Telephone Encounter (Signed)
Patients dad called back to speak to [facility].  He stated they had been talking about Auriemma medication.

## 2021-08-19 ENCOUNTER — Other Ambulatory Visit: Payer: Self-pay

## 2021-08-19 ENCOUNTER — Other Ambulatory Visit (HOSPITAL_COMMUNITY): Payer: Self-pay

## 2021-08-19 MED ORDER — DEPAKOTE SPRINKLES 125 MG PO CSDR
500.0000 mg | DELAYED_RELEASE_CAPSULE | Freq: Two times a day (BID) | ORAL | 0 refills | Status: DC
  Filled 2021-08-19: qty 60, 8d supply, fill #0

## 2021-08-19 MED ORDER — DEPAKOTE ER 500 MG PO TB24: ORAL_TABLET | ORAL | 3 refills | Status: DC

## 2021-08-19 NOTE — Addendum Note (Signed)
Addended by: Van Clines on: 08/19/2021 09:55 AM   Modules accepted: Orders

## 2021-08-23 ENCOUNTER — Other Ambulatory Visit (HOSPITAL_COMMUNITY): Payer: Self-pay

## 2021-08-23 MED ORDER — DEPAKOTE SPRINKLES 125 MG PO CSDR
500.0000 mg | DELAYED_RELEASE_CAPSULE | Freq: Two times a day (BID) | ORAL | 6 refills | Status: DC
  Filled 2021-08-23: qty 240, 30d supply, fill #0
  Filled 2021-08-25: qty 144, 18d supply, fill #0
  Filled 2021-08-25: qty 240, 30d supply, fill #0

## 2021-08-23 NOTE — Addendum Note (Signed)
Addended by: Van Clines on: 08/23/2021 02:48 PM   Modules accepted: Orders

## 2021-08-25 ENCOUNTER — Other Ambulatory Visit (HOSPITAL_COMMUNITY): Payer: Self-pay

## 2021-08-26 ENCOUNTER — Other Ambulatory Visit (HOSPITAL_COMMUNITY): Payer: Self-pay

## 2021-08-26 ENCOUNTER — Telehealth (HOSPITAL_COMMUNITY): Payer: Self-pay | Admitting: Pharmacy Technician

## 2021-08-26 MED ORDER — DEPAKOTE ER 500 MG PO TB24
500.0000 mg | ORAL_TABLET | Freq: Two times a day (BID) | ORAL | 3 refills | Status: DC
  Filled 2021-08-26: qty 180, 90d supply, fill #0

## 2021-08-26 NOTE — Telephone Encounter (Signed)
Patient Advocate Encounter  Prior Authorization for Depakote Sprinkles 125 mg capsules has been approved.    PA# [id number] Effective dates: 08/26/2021 through 08/26/2022     Roland Earl, CPhT Pharmacy Patient Advocate Specialist [facility] Patient Advocate Team Direct Number: [phone]  Fax: [phone]

## 2021-08-26 NOTE — Addendum Note (Signed)
Addended by: Van Clines on: 08/26/2021 04:04 PM   Modules accepted: Orders

## 2021-08-26 NOTE — Telephone Encounter (Signed)
Patient Advocate Encounter   Received notification that prior authorization for Depakote Sprinkles 125 mg capsules is required.   PA submitted on 08/26/2021 Confirmation # [id number] W  Tracks Status is pending       Roland Earl, CPhT Pharmacy Patient Advocate Specialist [facility] Patient Advocate Team Direct Number: [phone]  Fax: [phone]

## 2021-08-29 ENCOUNTER — Other Ambulatory Visit: Payer: Self-pay | Admitting: Neurology

## 2021-08-29 ENCOUNTER — Other Ambulatory Visit (HOSPITAL_COMMUNITY): Payer: Self-pay

## 2021-08-29 MED ORDER — DEPAKOTE ER 250 MG PO TB24
500.0000 mg | ORAL_TABLET | Freq: Two times a day (BID) | ORAL | 6 refills | Status: DC
  Filled 2021-08-29: qty 360, 90d supply, fill #0
  Filled 2021-11-28: qty 360, 90d supply, fill #1
  Filled 2022-02-22: qty 360, 90d supply, fill #2
  Filled 2022-05-21: qty 360, 90d supply, fill #3

## 2021-09-12 ENCOUNTER — Other Ambulatory Visit (HOSPITAL_COMMUNITY): Payer: Self-pay

## 2021-10-03 ENCOUNTER — Encounter: Payer: Self-pay | Admitting: Neurology

## 2021-10-03 ENCOUNTER — Ambulatory Visit: Payer: Medicaid Other | Admitting: Neurology

## 2021-10-03 VITALS — BP 133/79 | HR 66 | Wt 211.0 lb

## 2021-10-03 DIAGNOSIS — G40209 Localization-related (focal) (partial) symptomatic epilepsy and epileptic syndromes with complex partial seizures, not intractable, without status epilepticus: Secondary | ICD-10-CM | POA: Diagnosis not present

## 2021-10-03 NOTE — Progress Notes (Signed)
NEUROLOGY FOLLOW UP OFFICE NOTE  FRASER BUSCHE [id number] [birth year]  HISTORY OF PRESENT ILLNESS: I had the pleasure of seeing Carl Bryant in follow-up in the neurology clinic on 10/03/2021.  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 He is accompanied by his mother who helps supplement the history today.  Records and images were personally reviewed where available.  Since his last visit, Depakote DR was discontinued. He has been on brand name Depakote since infancy. He is now on Depakote ER 250mg  2 tabs BID (500mg  BID) with no side effects. They continue to deny any seizures or seizure-like symptoms since 12/1998. No staring/unresponsive episodes, he denies any myoclonic jerks, focal numbness/tingling. He has spastic contracture on the right UE and has been having more pain. He gets Botox, last session was a year ago. His mother reports that for many years now, he would get overheated easily. He saw his PCP a few months ago with normal bloodwork. His bone density scan in 11/2020 was normal.   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 His father is present to provide the history, records from his pediatric neurologist Dr. Reuel Boom were reviewed. His father reports seizures started at 35-18 months of age when he had a febrile seizure. He then started having drop seizures every 3-6 months where he would have quick loss of tone then be right back. He had rare GTCs, and had a longer one at age 34, which was his last seizure (01/17/1999). He is on brand Depakote DR 250mg  four times a day without side effects. His father reports his PCP checks bloodwork regularly. When he was younger, there would be little times where  he would stare and look around but would respond when called. His father has not noticed these as often now. He denies any headaches, dizziness, vision changes, focal numbness/tingling, bowel/bladder dysfunction. He has had surgery to correct contractures of right wrist, elbow, and foot at [facility], and continues to see them for Botox (last session was 1.5 years ago). He remains active, although he has not been able to go skiing recently because his right foot is turning out more again. Sleep is good. He lives with his parents who administer medications.   Diagnostic Data: no prior EEG or brain imaging available for review  PAST MEDICAL HISTORY: Past Medical History:  Diagnosis Date   Cerebral palsy (HCC)    Congenital hemiparesis (HCC)    Developmental language disorder    Seizures (HCC)     MEDICATIONS: Current Outpatient Medications on File Prior to Visit  Medication Sig Dispense Refill   acetaminophen (TYLENOL) 325 MG tablet Take 650 mg by mouth every 6 (six) hours as needed for mild pain.      DEPAKOTE ER 250 MG 24 hr tablet Take 2 tablets (500 mg total) by mouth 2 (two) times daily. 360 tablet 6   fluticasone (FLOVENT HFA) 44 MCG/ACT inhaler Inhale 1 puff into the lungs in the morning and at bedtime. 1 each 5   montelukast (SINGULAIR) 10 MG tablet TAKE 1 TABLET BY MOUTH AT BEDTIME FOR ASTHMA  0   nystatin (MYCOSTATIN) 100000 UNIT/ML suspension SWISH OR GARGLE AND SPIT 10 CC 3-4 TIMES A DAY AS NEEDED     omeprazole (PRILOSEC) 40 MG  capsule Take 40 mg by mouth daily as needed.     PROAIR HFA 108 (90 Base) MCG/ACT inhaler TAKE 2 PUFFS EVERY 4 TO 6 HOURS AS NEEDED FOR COUGH/WHEEZE 18 g 3   No current facility-administered medications on file prior to visit.    ALLERGIES: No Known Allergies  FAMILY HISTORY: Family History  Adopted: Yes    SOCIAL HISTORY: Social History   Socioeconomic History   Marital status: Single    Spouse name: Not on file   Number of children: Not on  file   Years of education: Not on file   Highest education level: Not on file  Occupational History   Not on file  Tobacco Use   Smoking status: Never   Smokeless tobacco: Never  Vaping Use   Vaping Use: Never used  Substance and Sexual Activity   Alcohol use: No    Alcohol/week: 0.0 standard drinks of alcohol   Drug use: No   Sexual activity: Never  Other Topics Concern   Not on file  Social History Narrative   [name] is a 31 year old male. He does not attend school. He lives with both parents and he has one sister, [name], who is 40 yo. He enjoys baseball and video games   Social Determinants of Health   Financial Resource Strain: Not on file  Food Insecurity: Not on file  Transportation Needs: Not on file  Physical Activity: Not on file  Stress: Not on file  Social Connections: Not on file  Intimate Partner Violence: Not on file     PHYSICAL EXAM: Vitals:   10/03/21 1004  BP: 133/79  Pulse: 66  SpO2: 96%   General: No acute distress Head:  Normocephalic/atraumatic Skin/Extremities: No rash, no edema Neurological Exam: alert and awake. No aphasia or dysarthria. Attention and concentration are normal.   Cranial nerves: Pupils equal, round. Extraocular movements intact with no nystagmus. Visual fields full.  No facial asymmetry.  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  Finger to nose testing intact on left. Gait spastic right hemiparetic gait with right foot externally rotated, left knee is also bent. No tremor.   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 He has been seizure-free since 12/1998. He does not drive. Follow-up in 1  year, call for any changes.    Thank you for allowing me to participate in his care.  Please do not hesitate to call for any questions or concerns.    01/1999, M.D.   CC: Dr. Patrcia Dolly

## 2021-10-03 NOTE — Patient Instructions (Addendum)
Good to see you doing well. Continue Depakote ER 250mg : take 2 tablets twice a day. Follow-up in 1 year, call for any changes   Seizure Precautions: 1. If medication has been prescribed for you to prevent seizures, take it exactly as directed.  Do not stop taking the medicine without talking to your doctor first, even if you have not had a seizure in a long time.   2. Avoid activities in which a seizure would cause danger to yourself or to others.  Don't operate dangerous machinery, swim alone, or climb in high or dangerous places, such as on ladders, roofs, or girders.  Do not drive unless your doctor says you may.  3. If you have any warning that you may have a seizure, lay down in a safe place where you can't hurt yourself.    4.  No driving for 6 months from last seizure, as per [facility].   Please refer to the following link on the Epilepsy Foundation of America's website for more information: http://www.epilepsyfoundation.org/answerplace/Social/driving/drivingu.cfm   5.  Maintain good sleep hygiene. Avoid alcohol.  6.  Contact your doctor if you have any problems that may be related to the medicine you are taking.  7.  Call 911 and bring the patient back to the ED if:        A.  The seizure lasts longer than 5 minutes.       B.  The patient doesn't awaken shortly after the seizure  C.  The patient has new problems such as difficulty seeing, speaking or moving  D.  The patient was injured during the seizure  E.  The patient has a temperature over 102 F (39C)  F.  The patient vomited and now is having trouble breathing

## 2021-11-28 ENCOUNTER — Other Ambulatory Visit (HOSPITAL_COMMUNITY): Payer: Self-pay

## 2021-11-29 ENCOUNTER — Other Ambulatory Visit (HOSPITAL_COMMUNITY): Payer: Self-pay

## 2022-02-08 ENCOUNTER — Encounter: Payer: Self-pay | Admitting: Neurology

## 2022-05-22 ENCOUNTER — Other Ambulatory Visit: Payer: Self-pay

## 2022-05-22 ENCOUNTER — Other Ambulatory Visit (HOSPITAL_COMMUNITY): Payer: Self-pay

## 2022-08-11 ENCOUNTER — Encounter: Payer: Self-pay | Admitting: Neurology

## 2022-08-11 ENCOUNTER — Other Ambulatory Visit: Payer: Self-pay

## 2022-08-11 ENCOUNTER — Other Ambulatory Visit (HOSPITAL_COMMUNITY): Payer: Self-pay

## 2022-08-11 ENCOUNTER — Ambulatory Visit (INDEPENDENT_AMBULATORY_CARE_PROVIDER_SITE_OTHER): Payer: MEDICAID | Admitting: Neurology

## 2022-08-11 VITALS — BP 134/78 | HR 98 | Ht 68.0 in | Wt 217.0 lb

## 2022-08-11 DIAGNOSIS — G40209 Localization-related (focal) (partial) symptomatic epilepsy and epileptic syndromes with complex partial seizures, not intractable, without status epilepticus: Secondary | ICD-10-CM

## 2022-08-11 MED ORDER — DEPAKOTE ER 250 MG PO TB24
500.0000 mg | ORAL_TABLET | Freq: Two times a day (BID) | ORAL | 6 refills | Status: DC
  Filled 2022-08-11: qty 360, 90d supply, fill #0
  Filled 2022-11-14: qty 360, 90d supply, fill #1
  Filled 2023-02-12: qty 360, 90d supply, fill #2
  Filled 2023-05-10: qty 360, 90d supply, fill #3
  Filled 2023-08-08: qty 360, 90d supply, fill #4

## 2022-08-11 NOTE — Patient Instructions (Signed)
Good to see you doing well. Continue Depakote 250mg : take 2 tablets twice a day. Please see your PCP about the rash. Follow-up in 1 year, call for any changes.   Seizure Precautions: 1. If medication has been prescribed for you to prevent seizures, take it exactly as directed.  Do not stop taking the medicine without talking to your doctor first, even if you have not had a seizure in a long time.   2. Avoid activities in which a seizure would cause danger to yourself or to others.  Don't operate dangerous machinery, swim alone, or climb in high or dangerous places, such as on ladders, roofs, or girders.  Do not drive unless your doctor says you may.  3. If you have any warning that you may have a seizure, lay down in a safe place where you can't hurt yourself.    4.  No driving for 6 months from last seizure, as per [facility].   Please refer to the following link on the Epilepsy Foundation of America's website for more information: http://www.epilepsyfoundation.org/answerplace/Social/driving/drivingu.cfm   5.  Maintain good sleep hygiene.  6.  Contact your doctor if you have any problems that may be related to the medicine you are taking.  7.  Call 911 and bring the patient back to the ED if:        A.  The seizure lasts longer than 5 minutes.       B.  The patient doesn't awaken shortly after the seizure  C.  The patient has new problems such as difficulty seeing, speaking or moving  D.  The patient was injured during the seizure  E.  The patient has a temperature over 102 F (39C)  F.  The patient vomited and now is having trouble breathing

## 2022-08-11 NOTE — Progress Notes (Signed)
NEUROLOGY FOLLOW UP OFFICE NOTE  Carl Bryant [id number] 1990/12/01  HISTORY OF PRESENT ILLNESS: I had the pleasure of seeing Carl Bryant in follow-up in the neurology clinic on 08/11/2022.  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 He is again accompanied by his mother who provides the history today.  Records and images were personally reviewed where available.  He is on Depakote ER 250mg  2 tabs BID (500mg  BID) without side effects, seizure-free since 12/1998. His mother denies any staring/unresponsive episodes. He denies any myoclonic jerks, no focal numbness/tingling. He denies any headaches except for sinus headaches. No dizziness, vision changes, no falls. He has been going to camp every week and recently came back from overnight camp where he got a rash in both arms.    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 His father is present to provide the history, records from his pediatric neurologist Dr. Sharene Skeans were reviewed. His father reports seizures started at 30-1 months of age when he had a febrile seizure. He then started having drop seizures every 3-6 months where he would have quick loss of tone then be right back. He had rare GTCs, and had a longer one at age 63, which was his last seizure (01/17/1999). He is on brand Depakote DR 250mg  four times a day without side effects. His father reports his PCP checks bloodwork regularly. When he was younger, there would be little times where he would stare and look around but would respond when called. His father has not noticed these as often now. He denies any headaches, dizziness, vision changes, focal numbness/tingling,  bowel/bladder dysfunction. 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 He remains active, although he has not been able to go skiing recently because his right foot is turning out more again. Sleep is good. He lives with his parents who administer medications.   Diagnostic Data: no prior EEG or brain imaging available for review PAST MEDICAL HISTORY: Past Medical History:  Diagnosis Date   Cerebral palsy (HCC)    Congenital hemiparesis (HCC)    Developmental language disorder    Seizures (HCC)     MEDICATIONS: Current Outpatient Medications on File Prior to Visit  Medication Sig Dispense Refill   acetaminophen (TYLENOL) 325 MG tablet Take 650 mg by mouth every 6 (six) hours as needed for mild pain.      DEPAKOTE ER 250 MG 24 hr tablet Take 2 tablets (500 mg total) by mouth 2 (two) times daily. 360 tablet 6   fluticasone (FLOVENT HFA) 44 MCG/ACT inhaler Inhale 1 puff into the lungs in the morning and at bedtime. 1 each 5   montelukast (SINGULAIR) 10 MG tablet TAKE 1 TABLET BY MOUTH AT BEDTIME FOR ASTHMA  0   nystatin (MYCOSTATIN) 100000 UNIT/ML suspension SWISH OR GARGLE AND SPIT 10 CC 3-4 TIMES A DAY AS NEEDED     omeprazole (PRILOSEC) 40 MG capsule Take 40 mg by mouth daily as needed.     PROAIR HFA 108 (90 Base) MCG/ACT inhaler TAKE 2 PUFFS EVERY 4 TO 6 HOURS AS NEEDED FOR COUGH/WHEEZE 18 g 3   No current  facility-administered medications on file prior to visit.    ALLERGIES: No Known Allergies  FAMILY HISTORY: Family History  Adopted: Yes    SOCIAL HISTORY: Social History   Socioeconomic History   Marital status: Single    Spouse name: Not on file   Number of children: Not on file   Years of education: Not on file   Highest education level: Not on file  Occupational History   Not on file  Tobacco Use   Smoking status: Never   Smokeless tobacco: Never   Vaping Use   Vaping status: Never Used  Substance and Sexual Activity   Alcohol use: No    Alcohol/week: 0.0 standard drinks of alcohol   Drug use: No   Sexual activity: Never  Other Topics Concern   Not on file  Social History Narrative   Carl Bryant is a 32 year old male. He does not attend school. He lives with both parents and he has one sister, Carl Bryant, who is 28 yo. He enjoys baseball and video games   Social Determinants of Health   Financial Resource Strain: Not on file  Food Insecurity: Not on file  Transportation Needs: Not on file  Physical Activity: Not on file  Stress: Not on file  Social Connections: Not on file  Intimate Partner Violence: Not on file     PHYSICAL EXAM: Vitals:   08/11/22 1112  BP: 134/78  Pulse: 98  SpO2: 98%   General: No acute distress Head:  Normocephalic/atraumatic Skin/Extremities: No rash, no edema Neurological Exam: alert and awake. No aphasia or dysarthria. Fund of knowledge is appropriate.  Attention and concentration are normal.   Cranial nerves: Pupils equal, round. Extraocular movements intact with no nystagmus. Visual fields full.  No facial asymmetry.  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 5/5 on left UE and LE. Finger to nose testing intact on left.  Gait spastic right hemiparetic gait with both knees bent while ambulating. No tremor.    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 Refills sent. He does not drive. Discuss rash with PCP. Follow-up in 1 year, call for any changes.   Thank you for allowing me to participate in his care.  Please do not hesitate to call for any questions or concerns.   Patrcia Dolly, M.D.   CC: Dr. Jacky Kindle

## 2022-08-14 ENCOUNTER — Ambulatory Visit: Payer: Medicaid Other | Admitting: Neurology

## 2022-08-14 ENCOUNTER — Other Ambulatory Visit (HOSPITAL_COMMUNITY): Payer: Self-pay

## 2022-11-14 ENCOUNTER — Other Ambulatory Visit: Payer: Self-pay

## 2023-02-12 ENCOUNTER — Other Ambulatory Visit (HOSPITAL_COMMUNITY): Payer: Self-pay

## 2023-02-12 ENCOUNTER — Other Ambulatory Visit: Payer: Self-pay

## 2023-03-05 ENCOUNTER — Other Ambulatory Visit (HOSPITAL_COMMUNITY): Payer: Self-pay

## 2023-05-10 ENCOUNTER — Other Ambulatory Visit: Payer: Self-pay

## 2023-05-10 ENCOUNTER — Other Ambulatory Visit (HOSPITAL_COMMUNITY): Payer: Self-pay

## 2023-05-11 ENCOUNTER — Other Ambulatory Visit: Payer: Self-pay

## 2023-07-19 ENCOUNTER — Encounter: Payer: Self-pay | Admitting: Neurology

## 2023-07-24 ENCOUNTER — Ambulatory Visit: Payer: MEDICAID | Admitting: Neurology

## 2023-08-08 ENCOUNTER — Other Ambulatory Visit: Payer: Self-pay | Admitting: Neurology

## 2023-08-08 ENCOUNTER — Other Ambulatory Visit (HOSPITAL_COMMUNITY): Payer: Self-pay

## 2023-08-08 ENCOUNTER — Other Ambulatory Visit: Payer: Self-pay

## 2023-08-08 MED ORDER — DEPAKOTE ER 250 MG PO TB24
500.0000 mg | ORAL_TABLET | Freq: Two times a day (BID) | ORAL | 3 refills | Status: AC
  Filled 2023-08-08: qty 360, 90d supply, fill #0
  Filled 2023-11-05: qty 360, 90d supply, fill #1
  Filled 2024-02-01: qty 360, 90d supply, fill #2

## 2023-08-08 MED ORDER — DEPAKOTE ER 250 MG PO TB24
ORAL_TABLET | ORAL | 3 refills | Status: DC
  Filled 2023-08-08: qty 360, 90d supply, fill #0

## 2023-08-09 ENCOUNTER — Other Ambulatory Visit (HOSPITAL_COMMUNITY): Payer: Self-pay

## 2023-09-21 ENCOUNTER — Encounter: Payer: Self-pay | Admitting: Neurology

## 2023-09-21 ENCOUNTER — Ambulatory Visit: Payer: MEDICAID | Admitting: Neurology

## 2023-09-21 VITALS — BP 148/82 | HR 95 | Resp 20 | Wt 221.0 lb

## 2023-09-21 DIAGNOSIS — G40209 Localization-related (focal) (partial) symptomatic epilepsy and epileptic syndromes with complex partial seizures, not intractable, without status epilepticus: Secondary | ICD-10-CM

## 2023-09-21 NOTE — Patient Instructions (Signed)
 It's always good to see you! Continue Depakote  ER 250mg : take 2 tablets twice a day. Follow-up in July 2026, call for any changes   Seizure Precautions: 1. If medication has been prescribed for you to prevent seizures, take it exactly as directed.  Do not stop taking the medicine without talking to your doctor first, even if you have not had a seizure in a long time.   2. Avoid activities in which a seizure would cause danger to yourself or to others.  Don't operate dangerous machinery, swim alone, or climb in high or dangerous places, such as on ladders, roofs, or girders.  Do not drive unless your doctor says you may.  3. If you have any warning that you may have a seizure, lay down in a safe place where you can't hurt yourself.    4.  No driving for 6 months from last seizure, as per [place]  state law.   Please refer to the following link on the [employer]ile[employer]'s website for more information: http://www.epilepsyfoundation.org/answerplace/Social/driving/drivingu.cfm   5.  Maintain good sleep hygiene.   6.  Contact your doctor if you have any problems that may be related to the medicine you are taking.  7.  Call 911 and bring the patient back to the ED if:        A.  The seizure lasts longer than 5 minutes.       B.  The patient doesn't awaken shortly after the seizure  C.  The patient has new problems such as difficulty seeing, speaking or moving  D.  The patient was injured during the seizure  E.  The patient has a temperature over 102 F (39C)  F.  The patient vomited and now is having trouble breathing

## 2023-09-21 NOTE — Progress Notes (Signed)
 NEUROLOGY FOLLOW UP OFFICE NOTE  OAKLAN PERSONS [id number] [birth year]  HISTORY OF PRESENT ILLNESS: I had the pleasure of seeing Carl Bryant in follow-up in the neurology clinic on 09/21/2023.  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 He is again accompanied by his mother who helps supplement the history today.  Records and images were personally reviewed where available.  Since his last visit, he continues to do well seizure-free since 12/1998 on brand Depakote  ER 500mg  BID (250mg : 2 tabs BID) without side effects. His mother denies any staring/unresponsive episodes. No headaches, dizziness, focal numbness/tingling, myoclonic jerks. Mood and sleep are good. 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 His father is present to provide the history, records from his pediatric neurologist Dr. Susen were reviewed. His father reports seizures started at 26-54 months of age when he had a febrile seizure. He then started having drop seizures every 3-6 months where he would have quick loss of tone then be right back. He had rare GTCs, and had a longer one at age 62, which was his last seizure (01/17/1999). He is on brand Depakote  DR 250mg  four times a day without side effects. His father reports his PCP checks bloodwork regularly. When he was younger, there would be little times where he would stare and look around but would respond when called. His father has not noticed these as often now. He denies any headaches, dizziness, vision changes, focal numbness/tingling, bowel/bladder dysfunction. 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 He remains active, although he has not been able to go skiing recently because his right foot is turning out more again. Sleep is good. He lives with his parents who administer medications.   Diagnostic Data: no prior EEG or brain imaging available for review    PAST MEDICAL HISTORY: Past Medical History:  Diagnosis Date   Cerebral palsy (HCC)    Congenital hemiparesis (HCC)    Developmental language disorder    Seizures (HCC)     MEDICATIONS: Current Outpatient Medications on File Prior to Visit  Medication Sig Dispense Refill   acetaminophen  (TYLENOL ) 325 MG tablet Take 650 mg by mouth every 6 (six) hours as needed for mild pain.      DEPAKOTE  ER 250 MG 24 hr tablet Take 2 tablets (500 mg total) by mouth 2 (two) times daily. *Brand name medically necessary 360 tablet 3   fluticasone  (FLOVENT  HFA) 44 MCG/ACT inhaler Inhale 1 puff into the lungs in the morning and at bedtime. 1 each 5   montelukast (SINGULAIR) 10 MG tablet TAKE 1 TABLET BY MOUTH AT BEDTIME FOR ASTHMA  0   nystatin (MYCOSTATIN) 100000 UNIT/ML suspension SWISH OR GARGLE AND SPIT 10 CC 3-4 TIMES A DAY AS NEEDED     omeprazole (PRILOSEC) 40 MG capsule Take 40 mg by mouth daily as needed.     PROAIR  HFA 108 (90 Base) MCG/ACT inhaler TAKE 2 PUFFS EVERY 4 TO 6 HOURS AS NEEDED FOR COUGH/WHEEZE 18 g 3   No current facility-administered medications  on file prior to visit.    ALLERGIES: No Known Allergies  FAMILY HISTORY: Family History  Adopted: Yes    SOCIAL HISTORY: Social History   Socioeconomic History   Marital status: Single    Spouse name: Not on file   Number of children: Not on file   Years of education: Not on file   Highest education level: Not on file  Occupational History   Not on file  Tobacco Use   Smoking status: Never   Smokeless tobacco: Never   Vaping Use   Vaping status: Never Used  Substance and Sexual Activity   Alcohol use: No    Alcohol/week: 0.0 standard drinks of alcohol   Drug use: No   Sexual activity: Never  Other Topics Concern   Not on file  Social History Narrative   Carl Bryant is a 33 year old male. He does not attend school. He lives with both parents and he has one sister, Carl Bryant, who is 23 yo. He enjoys baseball and video games   Social Drivers of Corporate investment banker Strain: Not on file  Food Insecurity: Not on file  Transportation Needs: Not on file  Physical Activity: Not on file  Stress: Not on file  Social Connections: Not on file  Intimate Partner Violence: Not on file     PHYSICAL EXAM: Vitals:   09/21/23 1529  BP: (!) 148/82  Pulse: 95  Resp: 20  SpO2: 98%   General: No acute distress Head:  Normocephalic/atraumatic Skin/Extremities: No rash, no edema Neurological Exam: alert and awake. Mild dysarthria. Fund of knowledge is appropriate. Attention and concentration are normal.   Cranial nerves: Pupils equal, round. Extraocular movements intact with no nystagmus. Visual fields full.  No facial asymmetry.  Motor: increased tone on right with spastic contracture of right UE and LE. 5/5 on left UE and LE. Finger to nose testing normal on left. Gait: spastic hemiparetic with both knees bent while ambulating (similar to prior). No tremors.    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 Continue close supervision. He does not drive. Follow-up in 1 year, call for any changes.    Thank you for allowing me to participate in his care.  Please do not hesitate to call for any questions or concerns.    Darice Shivers, M.D.   CC: Dr.  Shepard

## 2023-11-05 ENCOUNTER — Other Ambulatory Visit: Payer: Self-pay

## 2023-12-03 ENCOUNTER — Encounter: Payer: Self-pay | Admitting: Neurology

## 2024-02-01 ENCOUNTER — Other Ambulatory Visit (HOSPITAL_COMMUNITY): Payer: Self-pay

## 2024-02-01 ENCOUNTER — Other Ambulatory Visit: Payer: Self-pay

## 2024-02-04 ENCOUNTER — Other Ambulatory Visit (HOSPITAL_COMMUNITY): Payer: Self-pay

## 2024-02-05 ENCOUNTER — Other Ambulatory Visit (HOSPITAL_COMMUNITY): Payer: Self-pay

## 2024-02-28 ENCOUNTER — Ambulatory Visit: Payer: MEDICAID | Admitting: Neurology

## 2024-09-19 ENCOUNTER — Ambulatory Visit: Payer: MEDICAID | Admitting: Neurology
